# Patient Record
Sex: Female | Born: 1970 | Race: White | Hispanic: No | Marital: Married | Smoking: Never smoker
Health system: Southern US, Community
[De-identification: ages and names within clinical notes are randomized; demographics above are authoritative.]

## PROBLEM LIST (undated history)

## (undated) DIAGNOSIS — Z9012 Acquired absence of left breast and nipple: Secondary | ICD-10-CM

## (undated) DIAGNOSIS — C801 Malignant (primary) neoplasm, unspecified: Secondary | ICD-10-CM

## (undated) HISTORY — DX: Acquired absence of left breast and nipple: Z90.12

## (undated) HISTORY — DX: Malignant (primary) neoplasm, unspecified: C80.1

## (undated) HISTORY — PX: MASTECTOMY, PARTIAL: SHX709

## (undated) HISTORY — PX: AXILLARY SENTINEL NODE BIOPSY: SHX5738

---

## 1997-04-03 ENCOUNTER — Inpatient Hospital Stay (HOSPITAL_COMMUNITY): Admission: AD | Admit: 1997-04-03 | Discharge: 1997-04-03 | Payer: Self-pay | Admitting: Obstetrics and Gynecology

## 1997-05-14 ENCOUNTER — Ambulatory Visit (HOSPITAL_COMMUNITY): Admission: RE | Admit: 1997-05-14 | Discharge: 1997-05-14 | Payer: Self-pay | Admitting: Gynecology

## 1997-05-14 ENCOUNTER — Inpatient Hospital Stay (HOSPITAL_COMMUNITY): Admission: AD | Admit: 1997-05-14 | Discharge: 1997-05-14 | Payer: Self-pay | Admitting: General Surgery

## 1997-07-07 ENCOUNTER — Other Ambulatory Visit: Admission: RE | Admit: 1997-07-07 | Discharge: 1997-07-07 | Payer: Self-pay | Admitting: Gynecology

## 1997-10-05 ENCOUNTER — Inpatient Hospital Stay (HOSPITAL_COMMUNITY): Admission: AD | Admit: 1997-10-05 | Discharge: 1997-10-07 | Payer: Self-pay | Admitting: Obstetrics and Gynecology

## 1997-11-24 ENCOUNTER — Other Ambulatory Visit: Admission: RE | Admit: 1997-11-24 | Discharge: 1997-11-24 | Payer: Self-pay | Admitting: Obstetrics and Gynecology

## 1999-03-08 ENCOUNTER — Encounter: Payer: Self-pay | Admitting: Urology

## 1999-03-08 ENCOUNTER — Encounter: Admission: RE | Admit: 1999-03-08 | Discharge: 1999-03-08 | Payer: Self-pay | Admitting: Urology

## 1999-09-05 ENCOUNTER — Other Ambulatory Visit: Admission: RE | Admit: 1999-09-05 | Discharge: 1999-09-05 | Payer: Self-pay | Admitting: Obstetrics and Gynecology

## 2000-10-04 ENCOUNTER — Other Ambulatory Visit: Admission: RE | Admit: 2000-10-04 | Discharge: 2000-10-04 | Payer: Self-pay | Admitting: Gynecology

## 2001-08-11 ENCOUNTER — Inpatient Hospital Stay (HOSPITAL_COMMUNITY): Admission: AD | Admit: 2001-08-11 | Discharge: 2001-08-13 | Payer: Self-pay | Admitting: Gynecology

## 2001-09-24 ENCOUNTER — Other Ambulatory Visit: Admission: RE | Admit: 2001-09-24 | Discharge: 2001-09-24 | Payer: Self-pay | Admitting: Gynecology

## 2003-10-14 ENCOUNTER — Other Ambulatory Visit: Admission: RE | Admit: 2003-10-14 | Discharge: 2003-10-14 | Payer: Self-pay | Admitting: Gynecology

## 2006-04-30 ENCOUNTER — Other Ambulatory Visit: Admission: RE | Admit: 2006-04-30 | Discharge: 2006-04-30 | Payer: Self-pay | Admitting: Gynecology

## 2007-05-03 ENCOUNTER — Other Ambulatory Visit: Admission: RE | Admit: 2007-05-03 | Discharge: 2007-05-03 | Payer: Self-pay | Admitting: Gynecology

## 2007-08-06 DIAGNOSIS — L709 Acne, unspecified: Secondary | ICD-10-CM | POA: Insufficient documentation

## 2008-06-29 ENCOUNTER — Ambulatory Visit: Payer: Self-pay | Admitting: Family Medicine

## 2010-06-03 NOTE — Discharge Summary (Signed)
   NAME:  Megan Ayala, Megan Ayala                         ACCOUNT NO.:  [id number]   MEDICAL RECORD NO.:  [id number]                   PATIENT TYPE:  INP   LOCATION:  9135                                 FACILITY:  [facility]   PHYSICIAN:  Timothy P. Fontaine, M.D.           DATE OF BIRTH:  1970/04/19   DATE OF ADMISSION:  08/11/2001  DATE OF DISCHARGE:  08/13/2001                                 DISCHARGE SUMMARY   DISCHARGE DIAGNOSES:  1. Intrauterine pregnancy at 37 weeks, delivered.  2. Positive group B strep.  3. Rh negative.   HISTORY:  This is a 40 years of age married white female gravida 2 para 1  with an EDC of August 28, 2001.  Prenatal course uncomplicated, Rh negative,  was given RhoGAM in her pregnancy.   HOSPITAL COURSE:  On August 11, 2001 the patient was admitted at 37 weeks in  labor.  She was found to be in triage rim, -1, with a bulging bag of water.  Therefore, the patient was admitted and subsequently underwent a spontaneous  vaginal delivery of a female, Apgars of 9 and 9, weight  5 pounds, 7 ounces.  Postpartum the patient remained afebrile, voiding, in  stable condition.  She was discharged home on August 13, 2001 and given  instructions.  The baby is Rh negative; therefore, the patient was not given  any RhoGAM.   LABORATORY DATA:  On August 12, 2001 hemoglobin 12.6.   DISPOSITION:  The patient discharged to home, given prescription for Vicodin  to take p.r.n. for pain, #20.       Susa Loffler, P.A.                    Timothy P. Audie Box, M.D.    Ardath Sax  D:  09/13/2001  T:  09/13/2001  Job:  [id number]

## 2011-11-29 ENCOUNTER — Ambulatory Visit: Payer: Self-pay | Admitting: Orthopedic Surgery

## 2011-12-17 HISTORY — PX: MENISCUS REPAIR: SHX5179

## 2012-06-13 ENCOUNTER — Ambulatory Visit: Payer: Self-pay | Admitting: Family Medicine

## 2012-11-26 ENCOUNTER — Ambulatory Visit: Payer: Self-pay | Admitting: Podiatry

## 2013-09-05 ENCOUNTER — Ambulatory Visit (INDEPENDENT_AMBULATORY_CARE_PROVIDER_SITE_OTHER): Payer: BC Managed Care – PPO | Admitting: Podiatry

## 2013-09-05 ENCOUNTER — Ambulatory Visit (INDEPENDENT_AMBULATORY_CARE_PROVIDER_SITE_OTHER): Payer: BC Managed Care – PPO

## 2013-09-05 ENCOUNTER — Encounter: Payer: Self-pay | Admitting: Podiatry

## 2013-09-05 VITALS — BP 114/74 | HR 64 | Resp 16 | Ht 66.0 in | Wt 145.0 lb

## 2013-09-05 DIAGNOSIS — S92919A Unspecified fracture of unspecified toe(s), initial encounter for closed fracture: Secondary | ICD-10-CM

## 2013-09-05 DIAGNOSIS — S92911A Unspecified fracture of right toe(s), initial encounter for closed fracture: Secondary | ICD-10-CM

## 2013-09-05 DIAGNOSIS — S93609A Unspecified sprain of unspecified foot, initial encounter: Secondary | ICD-10-CM

## 2013-09-05 NOTE — Progress Notes (Signed)
   Subjective:    Patient ID: Megan Ayala, female    DOB: 07-Oct-1970, 43 y.o.   MRN: [id number]  HPI Comments: i have broken my 5th toe on my rt foot several times. i broke it the last time, last Sunday. i have broken it 3 or 4 times in the last year. Its getting worse. It hurts when you squeeze it. i tape my toes together.   Foot Pain Associated symptoms include abdominal pain.      Review of Systems  Gastrointestinal: Positive for abdominal pain and diarrhea.  All other systems reviewed and are negative.      Objective:   Physical Exam        Assessment & Plan:

## 2013-09-07 NOTE — Progress Notes (Signed)
Subjective:     Patient ID: Megan Ayala, female   DOB: Jul 10, 1970, 43 y.o.   MRN: [id number]  Foot Pain   patient presents stating I'm convinced I broke my fifth toe several times the worse pain this last week on Sunday   Review of Systems  All other systems reviewed and are negative.      Objective:   Physical Exam  Nursing note and vitals reviewed. Constitutional: She is oriented to person, place, and time.  Cardiovascular: Intact distal pulses.   Musculoskeletal: Normal range of motion.  Neurological: She is oriented to person, place, and time.  Skin: Skin is warm.   neurovascular status intact with muscle strength adequate range of motion of subtalar and midtarsal joint within normal limits. Mild edema around the fifth toe right but no color changes noted and is moderately discomfort at the head of the proximal phalanx with no instability of the toe noted. Patient's digits are well-perfused and she's well oriented x3     Assessment:     Possibility for fracture of the fifth toe right foot versus inflammatory contusion    Plan:     H&P and x-rays reviewed. I explained that there may be an old fracture here but I do not see a current acute fracture and at this point I would advised on wider shoes and trying to go barefoot as much as possible and wear open toed shoes when possible

## 2014-04-17 ENCOUNTER — Encounter: Payer: Self-pay | Admitting: Podiatry

## 2014-04-17 ENCOUNTER — Ambulatory Visit (INDEPENDENT_AMBULATORY_CARE_PROVIDER_SITE_OTHER): Payer: BLUE CROSS/BLUE SHIELD | Admitting: Podiatry

## 2014-04-17 ENCOUNTER — Other Ambulatory Visit: Payer: Self-pay | Admitting: Podiatry

## 2014-04-17 ENCOUNTER — Ambulatory Visit (INDEPENDENT_AMBULATORY_CARE_PROVIDER_SITE_OTHER): Payer: BLUE CROSS/BLUE SHIELD

## 2014-04-17 VITALS — BP 112/54 | HR 65 | Resp 16

## 2014-04-17 DIAGNOSIS — M779 Enthesopathy, unspecified: Secondary | ICD-10-CM

## 2014-04-17 DIAGNOSIS — M7662 Achilles tendinitis, left leg: Secondary | ICD-10-CM

## 2014-04-17 DIAGNOSIS — M722 Plantar fascial fibromatosis: Secondary | ICD-10-CM | POA: Diagnosis not present

## 2014-04-17 MED ORDER — TRIAMCINOLONE ACETONIDE 10 MG/ML IJ SUSP
10.0000 mg | Freq: Once | INTRAMUSCULAR | Status: AC
  Administered 2014-04-17: 10 mg

## 2014-04-17 NOTE — Patient Instructions (Addendum)
Plantar Fasciitis (Heel Spur Syndrome) with Rehab The plantar fascia is a fibrous, ligament-like, soft-tissue structure that spans the bottom of the foot. Plantar fasciitis is a condition that causes pain in the foot due to inflammation of the tissue. SYMPTOMS   Pain and tenderness on the underneath side of the foot.  Pain that worsens with standing or walking. CAUSES  Plantar fasciitis is caused by irritation and injury to the plantar fascia on the underneath side of the foot. Common mechanisms of injury include:  Direct trauma to bottom of the foot.  Damage to a small nerve that runs under the foot where the main fascia attaches to the heel bone.  Stress placed on the plantar fascia due to bone spurs. RISK INCREASES WITH:   Activities that place stress on the plantar fascia (running, jumping, pivoting, or cutting).  Poor strength and flexibility.  Improperly fitted shoes.  Tight calf muscles.  Flat feet.  Failure to warm-up properly before activity.  Obesity. PREVENTION  Warm up and stretch properly before activity.  Allow for adequate recovery between workouts.  Maintain physical fitness:  Strength, flexibility, and endurance.  Cardiovascular fitness.  Maintain a health body weight.  Avoid stress on the plantar fascia.  Wear properly fitted shoes, including arch supports for individuals who have flat feet.  PROGNOSIS  If treated properly, then the symptoms of plantar fasciitis usually resolve without surgery. However, occasionally surgery is necessary.  RELATED COMPLICATIONS   Recurrent symptoms that may result in a chronic condition.  Problems of the lower back that are caused by compensating for the injury, such as limping.  Pain or weakness of the foot during push-off following surgery.  Chronic inflammation, scarring, and partial or complete fascia tear, occurring more often from repeated injections.  TREATMENT  Treatment initially involves the  use of ice and medication to help reduce pain and inflammation. The use of strengthening and stretching exercises may help reduce pain with activity, especially stretches of the Achilles tendon. These exercises may be performed at home or with a therapist. Your caregiver may recommend that you use heel cups of arch supports to help reduce stress on the plantar fascia. Occasionally, corticosteroid injections are given to reduce inflammation. If symptoms persist for greater than 6 months despite non-surgical (conservative), then surgery may be recommended.   MEDICATION   If pain medication is necessary, then nonsteroidal anti-inflammatory medications, such as aspirin and ibuprofen, or other minor pain relievers, such as acetaminophen, are often recommended.  Do not take pain medication within 7 days before surgery.  Prescription pain relievers may be given if deemed necessary by your caregiver. Use only as directed and only as much as you need.  Corticosteroid injections may be given by your caregiver. These injections should be reserved for the most serious cases, because they may only be given a certain number of times.  HEAT AND COLD  Cold treatment (icing) relieves pain and reduces inflammation. Cold treatment should be applied for 10 to 15 minutes every 2 to 3 hours for inflammation and pain and immediately after any activity that aggravates your symptoms. Use ice packs or massage the area with a piece of ice (ice massage).  Heat treatment may be used prior to performing the stretching and strengthening activities prescribed by your caregiver, physical therapist, or athletic trainer. Use a heat pack or soak the injury in warm water.  SEEK IMMEDIATE MEDICAL CARE IF:  Treatment seems to offer no benefit, or the condition worsens.  Any medications   produce adverse side effects.  EXERCISES- RANGE OF MOTION (ROM) AND STRETCHING EXERCISES - Plantar Fasciitis (Heel Spur Syndrome) These exercises  may help you when beginning to rehabilitate your injury. Your symptoms may resolve with or without further involvement from your physician, physical therapist or athletic trainer. While completing these exercises, remember:   Restoring tissue flexibility helps normal motion to return to the joints. This allows healthier, less painful movement and activity.  An effective stretch should be held for at least 30 seconds.  A stretch should never be painful. You should only feel a gentle lengthening or release in the stretched tissue.  RANGE OF MOTION - Toe Extension, Flexion  Sit with your right / left leg crossed over your opposite knee.  Grasp your toes and gently pull them back toward the top of your foot. You should feel a stretch on the bottom of your toes and/or foot.  Hold this stretch for 10 seconds.  Now, gently pull your toes toward the bottom of your foot. You should feel a stretch on the top of your toes and or foot.  Hold this stretch for 10 seconds. Repeat  times. Complete this stretch 3 times per day.   RANGE OF MOTION - Ankle Dorsiflexion, Active Assisted  Remove shoes and sit on a chair that is preferably not on a carpeted surface.  Place right / left foot under knee. Extend your opposite leg for support.  Keeping your heel down, slide your right / left foot back toward the chair until you feel a stretch at your ankle or calf. If you do not feel a stretch, slide your bottom forward to the edge of the chair, while still keeping your heel down.  Hold this stretch for 10 seconds. Repeat 3 times. Complete this stretch 2 times per day.   STRETCH  Gastroc, Standing  Place hands on [place].  Extend right / left leg, keeping the front knee somewhat bent.  Slightly point your toes inward on your back foot.  Keeping your right / left heel on the floor and your knee straight, shift your weight toward the [place], not allowing your back to arch.  You should feel a gentle stretch  in the right / left calf. Hold this position for 10 seconds. Repeat 3 times. Complete this stretch 2 times per day.  STRETCH  Soleus, Standing  Place hands on [place].  Extend right / left leg, keeping the other knee somewhat bent.  Slightly point your toes inward on your back foot.  Keep your right / left heel on the floor, bend your back knee, and slightly shift your weight over the back leg so that you feel a gentle stretch deep in your back calf.  Hold this position for 10 seconds. Repeat 3 times. Complete this stretch 2 times per day.  STRETCH  Gastrocsoleus, Standing  Note: This exercise can place a lot of stress on your foot and ankle. Please complete this exercise only if specifically instructed by your caregiver.   Place the ball of your right / left foot on a step, keeping your other foot firmly on the same step.  Hold on to the [place] or a rail for balance.  Slowly lift your other foot, allowing your body weight to press your heel down over the edge of the step.  You should feel a stretch in your right / left calf.  Hold this position for 10 seconds.  Repeat this exercise with a slight bend in your right /   left knee. Repeat 3 times. Complete this stretch 2 times per day.   STRENGTHENING EXERCISES - Plantar Fasciitis (Heel Spur Syndrome)  These exercises may help you when beginning to rehabilitate your injury. They may resolve your symptoms with or without further involvement from your physician, physical therapist or athletic trainer. While completing these exercises, remember:   Muscles can gain both the endurance and the strength needed for everyday activities through controlled exercises.  Complete these exercises as instructed by your physician, physical therapist or athletic trainer. Progress the resistance and repetitions only as guided.  STRENGTH - Towel Curls  Sit in a chair positioned on a non-carpeted surface.  Place your foot on a towel, keeping your heel  on the floor.  Pull the towel toward your heel by only curling your toes. Keep your heel on the floor. Repeat 3 times. Complete this exercise 2 times per day.  STRENGTH - Ankle Inversion  Secure one end of a rubber exercise band/tubing to a fixed object (table, pole). Loop the other end around your foot just before your toes.  Place your fists between your knees. This will focus your strengthening at your ankle.  Slowly, pull your big toe up and in, making sure the band/tubing is positioned to resist the entire motion.  Hold this position for 10 seconds.  Have your muscles resist the band/tubing as it slowly pulls your foot back to the starting position. Repeat 3 times. Complete this exercises 2 times per day.  Document Released: 01/02/2005 Document Revised: 03/27/2011 Document Reviewed: 04/16/2008 [employer] Patient Information 2014 [employer]. Achilles Tendinitis  with Rehab Achilles tendinitis is a disorder of the Achilles tendon. The Achilles tendon connects the large calf muscles (Gastrocnemius and Soleus) to the heel bone (calcaneus). This tendon is sometimes called the heel cord. It is important for pushing-off and standing on your toes and is important for walking, running, or jumping. Tendinitis is often caused by overuse and repetitive microtrauma. SYMPTOMS  Pain, tenderness, swelling, warmth, and redness may occur over the Achilles tendon even at rest.  Pain with pushing off, or flexing or extending the ankle.  Pain that is worsened after or during activity. CAUSES   Overuse sometimes seen with rapid increase in exercise programs or in sports requiring running and jumping.  Poor physical conditioning (strength and flexibility or endurance).  Running sports, especially training running down hills.  Inadequate warm-up before practice or play or failure to stretch before participation.  Injury to the tendon. PREVENTION   Warm up and stretch before practice or  competition.  Allow time for adequate rest and recovery between practices and competition.  Keep up conditioning.  Keep up ankle and leg flexibility.  Improve or keep muscle strength and endurance.  Improve cardiovascular fitness.  Use proper technique.  Use proper equipment (shoes, skates).  To help prevent recurrence, taping, protective strapping, or an adhesive bandage may be recommended for several weeks after healing is complete. PROGNOSIS   Recovery may take weeks to several months to heal.  Longer recovery is expected if symptoms have been prolonged.  Recovery is usually quicker if the inflammation is due to a direct blow as compared with overuse or sudden strain. RELATED COMPLICATIONS   Healing time will be prolonged if the condition is not correctly treated. The injury must be given plenty of time to heal.  Symptoms can reoccur if activity is resumed too soon.  Untreated, tendinitis may increase the risk of tendon rupture requiring additional time for recovery   and possibly surgery. TREATMENT   The first treatment consists of rest anti-inflammatory medication, and ice to relieve the pain.  Stretching and strengthening exercises after resolution of pain will likely help reduce the risk of recurrence. Referral to a physical therapist or athletic trainer for further evaluation and treatment may be helpful.  A walking boot or cast may be recommended to rest the Achilles tendon. This can help break the cycle of inflammation and microtrauma.  Arch supports (orthotics) may be prescribed or recommended by your caregiver as an adjunct to therapy and rest.  Surgery to remove the inflamed tendon lining or degenerated tendon tissue is rarely necessary and has shown less than predictable results. MEDICATION   Nonsteroidal anti-inflammatory medications, such as aspirin and ibuprofen, may be used for pain and inflammation relief. Do not take within 7 days before surgery. Take  these as directed by your caregiver. Contact your caregiver immediately if any bleeding, stomach upset, or signs of allergic reaction occur. Other minor pain relievers, such as acetaminophen, may also be used.  Pain relievers may be prescribed as necessary by your caregiver. Do not take prescription pain medication for longer than 4 to 7 days. Use only as directed and only as much as you need.  Cortisone injections are rarely indicated. Cortisone injections may weaken tendons and predispose to rupture. It is better to give the condition more time to heal than to use them. HEAT AND COLD  Cold is used to relieve pain and reduce inflammation for acute and chronic Achilles tendinitis. Cold should be applied for 10 to 15 minutes every 2 to 3 hours for inflammation and pain and immediately after any activity that aggravates your symptoms. Use ice packs or an ice massage.  Heat may be used before performing stretching and strengthening activities prescribed by your caregiver. Use a heat pack or a warm soak. SEEK MEDICAL CARE IF:  Symptoms get worse or do not improve in 2 weeks despite treatment.  New, unexplained symptoms develop. Drugs used in treatment may produce side effects.  EXERCISES:  RANGE OF MOTION (ROM) AND STRETCHING EXERCISES - Achilles Tendinitis  These exercises may help you when beginning to rehabilitate your injury. Your symptoms may resolve with or without further involvement from your physician, physical therapist or athletic trainer. While completing these exercises, remember:   Restoring tissue flexibility helps normal motion to return to the joints. This allows healthier, less painful movement and activity.  An effective stretch should be held for at least 30 seconds.  A stretch should never be painful. You should only feel a gentle lengthening or release in the stretched tissue.  STRETCH  Gastroc, Standing   Place hands on [place].  Extend right / left leg, keeping the  front knee somewhat bent.  Slightly point your toes inward on your back foot.  Keeping your right / left heel on the floor and your knee straight, shift your weight toward the [place], not allowing your back to arch.  You should feel a gentle stretch in the right / left calf. Hold this position for 10 seconds. Repeat 3 times. Complete this stretch 2 times per day.  STRETCH  Soleus, Standing   Place hands on [place].  Extend right / left leg, keeping the other knee somewhat bent.  Slightly point your toes inward on your back foot.  Keep your right / left heel on the floor, bend your back knee, and slightly shift your weight over the back leg so that you feel a   gentle stretch deep in your back calf.  Hold this position for 10 seconds. Repeat 3 times. Complete this stretch 2 times per day.  STRETCH  Gastrocsoleus, Standing  Note: This exercise can place a lot of stress on your foot and ankle. Please complete this exercise only if specifically instructed by your caregiver.   Place the ball of your right / left foot on a step, keeping your other foot firmly on the same step.  Hold on to the [place] or a rail for balance.  Slowly lift your other foot, allowing your body weight to press your heel down over the edge of the step.  You should feel a stretch in your right / left calf.  Hold this position for 10 seconds.  Repeat this exercise with a slight bend in your knee. Repeat 3 times. Complete this stretch 2 times per day.   STRENGTHENING EXERCISES - Achilles Tendinitis These exercises may help you when beginning to rehabilitate your injury. They may resolve your symptoms with or without further involvement from your physician, physical therapist or athletic trainer. While completing these exercises, remember:   Muscles can gain both the endurance and the strength needed for everyday activities through controlled exercises.  Complete these exercises as instructed by your physician,  physical therapist or athletic trainer. Progress the resistance and repetitions only as guided.  You may experience muscle soreness or fatigue, but the pain or discomfort you are trying to eliminate should never worsen during these exercises. If this pain does worsen, stop and make certain you are following the directions exactly. If the pain is still present after adjustments, discontinue the exercise until you can discuss the trouble with your clinician.  STRENGTH - Plantar-flexors   Sit with your right / left leg extended. Holding onto both ends of a rubber exercise band/tubing, loop it around the ball of your foot. Keep a slight tension in the band.  Slowly push your toes away from you, pointing them downward.  Hold this position for 10 seconds. Return slowly, controlling the tension in the band/tubing. Repeat 3 times. Complete this exercise 2 times per day.   STRENGTH - Plantar-flexors   Stand with your feet shoulder width apart. Steady yourself with a [place] or table using as little support as needed.  Keeping your weight evenly spread over the width of your feet, rise up on your toes.*  Hold this position for 10 seconds. Repeat 3 times. Complete this exercise 2 times per day.  *If this is too easy, shift your weight toward your right / left leg until you feel challenged. Ultimately, you may be asked to do this exercise with your right / left foot only.  STRENGTH  Plantar-flexors, Eccentric  Note: This exercise can place a lot of stress on your foot and ankle. Please complete this exercise only if specifically instructed by your caregiver.   Place the balls of your feet on a step. With your hands, use only enough support from a [place] or rail to keep your balance.  Keep your knees straight and rise up on your toes.  Slowly shift your weight entirely to your right / left toes and pick up your opposite foot. Gently and with controlled movement, lower your weight through your right /  left foot so that your heel drops below the level of the step. You will feel a slight stretch in the back of your calf at the end position.  Use the healthy leg to help rise up onto   the balls of both feet, then lower weight only on the right / left leg again. Build up to 15 repetitions. Then progress to 3 consecutive sets of 15 repetitions.*  After completing the above exercise, complete the same exercise with a slight knee bend (about 30 degrees). Again, build up to 15 repetitions. Then progress to 3 consecutive sets of 15 repetitions.* Perform this exercise 2 times per day.  *When you easily complete 3 sets of 15, your physician, physical therapist or athletic trainer may advise you to add resistance by wearing a backpack filled with additional weight.  STRENGTH - Plantar Flexors, Seated   Sit on a chair that allows your feet to rest flat on the ground. If necessary, sit at the edge of the chair.  Keeping your toes firmly on the ground, lift your right / left heel as far as you can without increasing any discomfort in your ankle. Repeat 3 times. Complete this exercise 2 times a day.  

## 2014-04-20 NOTE — Progress Notes (Signed)
Subjective:     Patient ID: Megan Ayala, female   DOB: [birth year], 44 y.o.   MRN: [id number]  HPI patient presents stating I have had heel pain in the right bottom of the heel for several months and the back of my left heel has started to bother me with activity. I'm a very active person   Review of Systems     Objective:   Physical Exam  Her vascular status intact with muscle strength adequate range of motion within normal limits. Patient's noted to have plantar pain right of a moderate to intense nature at the insertion to the calcaneus with moderate depression of the arch is also noted to have mild posterior lateral pain of the Achilles tendon    Assessment:     Plantar fasciitis right with Achilles tendinitis of a moderate nature left    Plan:     H&P and both conditions discussed. Today I injected the right plantar fascia 3 mg Kenalog 5 mg Xylocaine and advised on physical therapy stretching exercises and Achilles tendons stretching for the left. Also went ahead for the right and I dispense fascial brace with instructions

## 2014-04-24 ENCOUNTER — Ambulatory Visit: Payer: BLUE CROSS/BLUE SHIELD | Admitting: Podiatry

## 2014-07-06 DIAGNOSIS — E559 Vitamin D deficiency, unspecified: Secondary | ICD-10-CM | POA: Insufficient documentation

## 2014-07-06 DIAGNOSIS — F32A Depression, unspecified: Secondary | ICD-10-CM | POA: Insufficient documentation

## 2014-07-06 DIAGNOSIS — R5383 Other fatigue: Secondary | ICD-10-CM | POA: Insufficient documentation

## 2014-07-06 DIAGNOSIS — K589 Irritable bowel syndrome without diarrhea: Secondary | ICD-10-CM | POA: Insufficient documentation

## 2014-07-06 DIAGNOSIS — F419 Anxiety disorder, unspecified: Secondary | ICD-10-CM | POA: Insufficient documentation

## 2014-07-06 DIAGNOSIS — F329 Major depressive disorder, single episode, unspecified: Secondary | ICD-10-CM | POA: Insufficient documentation

## 2014-07-06 DIAGNOSIS — K5900 Constipation, unspecified: Secondary | ICD-10-CM | POA: Insufficient documentation

## 2014-07-08 ENCOUNTER — Ambulatory Visit (INDEPENDENT_AMBULATORY_CARE_PROVIDER_SITE_OTHER): Payer: BLUE CROSS/BLUE SHIELD | Admitting: Physician Assistant

## 2014-07-08 ENCOUNTER — Encounter: Payer: Self-pay | Admitting: Physician Assistant

## 2014-07-08 VITALS — BP 100/62 | HR 78 | Temp 98.7°F | Resp 16 | Wt 150.6 lb

## 2014-07-08 DIAGNOSIS — R197 Diarrhea, unspecified: Secondary | ICD-10-CM

## 2014-07-08 DIAGNOSIS — E038 Other specified hypothyroidism: Secondary | ICD-10-CM | POA: Diagnosis not present

## 2014-07-08 NOTE — Patient Instructions (Signed)

## 2014-07-08 NOTE — Progress Notes (Signed)
   Subjective:    Patient ID: Megan Ayala, female    DOB: Aug 04, 1970, 44 y.o.   MRN: [id number]  Diarrhea  This is a recurrent problem. The current episode started 1 to 4 weeks ago. The problem occurs 2 to 4 times per day. The problem has been gradually improving. The stool consistency is described as watery. The patient states that diarrhea does not awaken her from sleep. Associated symptoms include abdominal pain, bloating, increased flatus and sweats. Pertinent negatives include no arthralgias, chills, coughing, fever, headaches, myalgias, URI, vomiting or weight loss. Nothing aggravates the symptoms. There are no known risk factors. Treatments tried: probiotics. The treatment provided mild relief. Her past medical history is significant for irritable bowel syndrome. There is no history of bowel resection, inflammatory bowel disease, malabsorption, a recent abdominal surgery or short gut syndrome.      Review of Systems  Constitutional: Negative for fever, chills, weight loss, fatigue and unexpected weight change.  HENT: Negative for trouble swallowing.   Respiratory: Negative for cough, choking, chest tightness, shortness of breath and wheezing.   Cardiovascular: Negative for chest pain, palpitations and leg swelling.  Gastrointestinal: Positive for abdominal pain, diarrhea, bloating and flatus. Negative for vomiting.  Endocrine: Negative for cold intolerance, heat intolerance, polydipsia, polyphagia and polyuria.  Genitourinary: Negative for dysuria, urgency, hematuria, flank pain, vaginal bleeding, vaginal discharge, difficulty urinating, vaginal pain, menstrual problem and pelvic pain.  Musculoskeletal: Negative for myalgias, arthralgias and neck stiffness.  Skin: Negative for color change and rash.  Allergic/Immunologic: Negative for environmental allergies, food allergies and immunocompromised state.  Neurological: Negative for dizziness, tremors, syncope, light-headedness, numbness  and headaches.  Hematological: Negative for adenopathy. Does not bruise/bleed easily.       Objective:   Physical Exam  Constitutional: She appears well-developed and well-nourished. No distress.  HENT:  Mouth/Throat: Uvula is midline, oropharynx is clear and moist and mucous membranes are normal.  Neck: Normal range of motion. Neck supple. No tracheal deviation present. No thyromegaly present.  Cardiovascular: Normal rate, regular rhythm and normal heart sounds.  Exam reveals no gallop and no friction rub.   No murmur heard. Pulmonary/Chest: Effort normal and breath sounds normal. No respiratory distress. She has no wheezes. She has no rales. She exhibits no tenderness.  Abdominal: Soft. Bowel sounds are normal. She exhibits no distension and no mass. There is no hepatosplenomegaly. There is tenderness in the right lower quadrant, epigastric area, periumbilical area, suprapubic area and left lower quadrant. There is no rebound, no guarding and no CVA tenderness. No hernia.  Lymphadenopathy:    She has no cervical adenopathy.  Skin: She is not diaphoretic.  Vitals reviewed.         Assessment & Plan:  1. Other specified hypothyroidism H/O this and has been off Synthroid for over one year.  Last TSH was 10.3 in 05/2013.  Will recheck labs and f/u pending lab results.  - TSH - T4, free - T3  2. Diarrhea H/O this.  A couple of years ago was the first occurrence.  Stool samples were ordered but she never did them.  Will recheck stool for cause.  Seems to be most likely IBS.  Offered KUB to check for constipation with leak around diarrhea, but she refused.  Will f/u pending labs.  - Stool Culture - Stool, WBC/Lactoferrin - Stool C-Diff Toxin Assay - CBC w/Diff

## 2014-07-09 ENCOUNTER — Telehealth: Payer: Self-pay

## 2014-07-09 LAB — CBC WITH DIFFERENTIAL/PLATELET
Basophils Absolute: 0 10*3/uL (ref 0.0–0.2)
Basos: 0 %
EOS (ABSOLUTE): 0.1 10*3/uL (ref 0.0–0.4)
Eos: 1 %
Hematocrit: 38.7 % (ref 34.0–46.6)
Hemoglobin: 13 g/dL (ref 11.1–15.9)
IMMATURE GRANS (ABS): 0 10*3/uL (ref 0.0–0.1)
IMMATURE GRANULOCYTES: 0 %
LYMPHS: 26 %
Lymphocytes Absolute: 2 10*3/uL (ref 0.7–3.1)
MCH: 30.1 pg (ref 26.6–33.0)
MCHC: 33.6 g/dL (ref 31.5–35.7)
MCV: 90 fL (ref 79–97)
MONOS ABS: 0.5 10*3/uL (ref 0.1–0.9)
Monocytes: 7 %
NEUTROS PCT: 66 %
Neutrophils Absolute: 5.1 10*3/uL (ref 1.4–7.0)
Platelets: 246 10*3/uL (ref 150–379)
RBC: 4.32 x10E6/uL (ref 3.77–5.28)
RDW: 13.5 % (ref 12.3–15.4)
WBC: 7.7 10*3/uL (ref 3.4–10.8)

## 2014-07-09 LAB — T4, FREE: Free T4: 0.87 ng/dL (ref 0.82–1.77)

## 2014-07-09 LAB — TSH: TSH: 5.69 u[IU]/mL — ABNORMAL HIGH (ref 0.450–4.500)

## 2014-07-09 LAB — T3: T3 TOTAL: 87 ng/dL (ref 71–180)

## 2014-07-09 MED ORDER — LEVOTHYROXINE SODIUM 25 MCG PO TABS: 25.0000 ug | ORAL_TABLET | Freq: Every day | ORAL | Status: DC

## 2014-07-09 NOTE — Telephone Encounter (Signed)
Patient advised as directed below. Patient verbalized understanding and agrees with treatment plan. 

## 2014-07-09 NOTE — Addendum Note (Signed)
Addended by: Mar Daring on: 07/09/2014 09:24 AM   Modules accepted: Orders

## 2014-07-09 NOTE — Telephone Encounter (Signed)
-----   Message from Mar Daring, PA-C sent at 07/09/2014  9:26 AM EDT ----- TSH is elevated indicating hypothyroidism.  Restart levothyroxine 25 mcg daily.  Will recheck TSH in 3 months.  All other labs are WNL.  No elevated WBC indicating no infection.  Thanks! -JB

## 2014-07-14 ENCOUNTER — Other Ambulatory Visit: Payer: Self-pay | Admitting: Physician Assistant

## 2014-07-16 ENCOUNTER — Telehealth: Payer: Self-pay

## 2014-07-16 LAB — CLOSTRIDIUM DIFFICILE EIA: C difficile Toxins A+B, EIA: NEGATIVE

## 2014-07-16 LAB — FECAL LACTOFERRIN, QUANT: Lactoferrin, Fecal, Quant.: 2.57 ug/mL(g) (ref 0.00–7.24)

## 2014-07-16 NOTE — Telephone Encounter (Signed)
Patient advised as directed below. Patient verbalized understanding.  

## 2014-07-16 NOTE — Telephone Encounter (Signed)
-----   Message from Mar Daring, PA-C sent at 07/16/2014  8:14 AM EDT ----- Stool culture is negative.  Other stool test still pending.  Thanks! JB

## 2014-07-18 LAB — STOOL CULTURE

## 2014-07-22 ENCOUNTER — Telehealth: Payer: Self-pay

## 2014-07-22 NOTE — Telephone Encounter (Signed)
-----   Message from Mar Daring, PA-C sent at 07/22/2014 11:34 AM EDT ----- Stool culture negative.

## 2014-07-22 NOTE — Telephone Encounter (Signed)
Patient advised as directed below. Patient verbalized understanding.  

## 2014-07-29 ENCOUNTER — Other Ambulatory Visit: Payer: Self-pay | Admitting: Family Medicine

## 2014-07-29 DIAGNOSIS — F32A Depression, unspecified: Secondary | ICD-10-CM

## 2014-07-29 DIAGNOSIS — F329 Major depressive disorder, single episode, unspecified: Secondary | ICD-10-CM

## 2014-07-29 MED ORDER — SERTRALINE HCL 100 MG PO TABS: 100.0000 mg | ORAL_TABLET | Freq: Every day | ORAL | Status: DC

## 2014-07-29 NOTE — Telephone Encounter (Signed)
Pt contacted office for refill request on the following medications:  sertraline (ZOLOFT) 100 MG tablet.  [facility] [address].  [phone].  This was pt of Debbie's/MJ

## 2015-04-02 ENCOUNTER — Encounter: Payer: Self-pay | Admitting: Physician Assistant

## 2015-04-02 ENCOUNTER — Ambulatory Visit (INDEPENDENT_AMBULATORY_CARE_PROVIDER_SITE_OTHER): Payer: BLUE CROSS/BLUE SHIELD | Admitting: Physician Assistant

## 2015-04-02 VITALS — BP 102/62 | HR 76 | Temp 98.2°F | Resp 18 | Wt 150.0 lb

## 2015-04-02 DIAGNOSIS — N644 Mastodynia: Secondary | ICD-10-CM

## 2015-04-02 NOTE — Patient Instructions (Signed)

## 2015-04-02 NOTE — Progress Notes (Signed)
Patient ID: Megan Ayala, female   DOB: 24-Feb-1970, 45 y.o.   MRN: [id number]       Patient: Megan Ayala Female    DOB: 1970-03-10   45 y.o.   MRN: [id number] Visit Date: 04/02/2015  Today's Provider: Mar Daring, PA-C   Chief Complaint  Patient presents with  . Breast Pain    Bilateral   Subjective:    HPI Pt reports that about a month ago she noticed that her both of her breast felt tender. She reports that they are tender to the touch and it is only intermittent. She has not done any physical activities that would cause it or had any URI symptoms. She reports that it could be hormonal but she isn't sure. She has not felt any lumps in her breast. She has been performing self breast exams but has never had a mammogram.    Allergies  Allergen Reactions  . Levaquin [Levofloxacin In D5w] Other (See Comments)    Heart race  . Prozac [Fluoxetine Hcl] Rash   Previous Medications   B COMPLEX-C PO    Take 1 tablet by mouth daily.   CHOLECALCIFEROL (VITAMIN D) 2000 UNITS CAPS    Take 2 capsules by mouth daily.   FLUTICASONE (FLONASE) 50 MCG/ACT NASAL SPRAY    Place 2 sprays into the nose daily.   LEVOTHYROXINE (SYNTHROID, LEVOTHROID) 25 MCG TABLET    Take 1 tablet (25 mcg total) by mouth daily.   MELATONIN ER PO    Take by mouth daily.   MELOXICAM (MOBIC) 15 MG TABLET    Take 15 mg by mouth daily.   MULTIPLE VITAMIN PO    Take 1 tablet by mouth daily.   SERTRALINE (ZOLOFT) 100 MG TABLET    Take 1 tablet (100 mg total) by mouth daily.    Review of Systems  Constitutional: Negative.   HENT: Negative.   Eyes: Negative.   Respiratory: Negative.   Cardiovascular: Negative.   Gastrointestinal: Negative.   Endocrine: Negative.   Genitourinary:       Breast pain  Musculoskeletal: Negative.   Skin: Negative.   Allergic/Immunologic: Negative.   Neurological: Negative.   Hematological: Negative.   Psychiatric/Behavioral: Negative.     Social History  Substance Use  Topics  . Smoking status: Never Smoker   . Smokeless tobacco: Not on file  . Alcohol Use: Yes   Objective:   There were no vitals taken for this visit.  Physical Exam  Constitutional: She appears well-developed and well-nourished. No distress.  Cardiovascular: Normal rate, regular rhythm and normal heart sounds.  Exam reveals no gallop and no friction rub.   No murmur heard. Pulmonary/Chest: Effort normal and breath sounds normal. No respiratory distress. She has no wheezes. She has no rales. Right breast exhibits no inverted nipple, no mass, no nipple discharge, no skin change and no tenderness. Left breast exhibits no inverted nipple, no mass, no nipple discharge, no skin change and no tenderness. Breasts are symmetrical.  Skin: She is not diaphoretic.  Vitals reviewed.       Assessment & Plan:     1. Breast tenderness in female She is going to see if it seems to correlate near her menstrual cycle as she thinks it may but is unsure.  She has had no lumps, breast changes, texture changes in skin, asymmetry, or nipple discharge.  She is to continue self breast exams and I will see her back in 8 weeks for her CPE  with pap.  We will order mammogram at that time.       Mar Daring, PA-C  [facility]

## 2015-05-28 ENCOUNTER — Encounter: Payer: BLUE CROSS/BLUE SHIELD | Admitting: Physician Assistant

## 2015-06-04 ENCOUNTER — Ambulatory Visit (INDEPENDENT_AMBULATORY_CARE_PROVIDER_SITE_OTHER): Payer: BLUE CROSS/BLUE SHIELD | Admitting: Physician Assistant

## 2015-06-04 ENCOUNTER — Encounter: Payer: Self-pay | Admitting: Physician Assistant

## 2015-06-04 VITALS — BP 90/60 | HR 89 | Temp 98.2°F | Resp 16 | Ht 67.0 in | Wt 151.0 lb

## 2015-06-04 DIAGNOSIS — Z13220 Encounter for screening for lipoid disorders: Secondary | ICD-10-CM

## 2015-06-04 DIAGNOSIS — Z136 Encounter for screening for cardiovascular disorders: Secondary | ICD-10-CM

## 2015-06-04 DIAGNOSIS — Z1239 Encounter for other screening for malignant neoplasm of breast: Secondary | ICD-10-CM | POA: Diagnosis not present

## 2015-06-04 DIAGNOSIS — Z124 Encounter for screening for malignant neoplasm of cervix: Secondary | ICD-10-CM | POA: Diagnosis not present

## 2015-06-04 DIAGNOSIS — Z0000 Encounter for general adult medical examination without abnormal findings: Secondary | ICD-10-CM | POA: Diagnosis not present

## 2015-06-04 DIAGNOSIS — E038 Other specified hypothyroidism: Secondary | ICD-10-CM

## 2015-06-04 DIAGNOSIS — J069 Acute upper respiratory infection, unspecified: Secondary | ICD-10-CM

## 2015-06-04 MED ORDER — AMOXICILLIN 875 MG PO TABS: 875.0000 mg | ORAL_TABLET | Freq: Two times a day (BID) | ORAL | Status: DC

## 2015-06-04 NOTE — Progress Notes (Signed)
Patient: Megan Ayala, Female    DOB: 11-May-1970, 45 y.o.   MRN: [id number] Visit Date: 06/04/2015  Today's Provider: Mar Daring, PA-C   Chief Complaint  Patient presents with  . Annual Exam   Subjective:    Annual physical exam Megan Ayala is a 45 y.o. female who presents today for health maintenance and complete physical. She feels fairly well-she reports she developed the cold that she has a week ago and is not getting any better. Is usually just in the mornings and evenings. No Fever, SOB,wheezing, or chest tightness. She reports exercising 3 times a week, cardio. She reports she is sleeping well.  She did have some breast tenderness about one month ago that was fairly new onset. It was in both breast and no masses noted. Today she admits it is most frequent around her menstrual cycle.  Pap: unknown when her last was Mammogram: Never Colonoscopy: N/A -----------------------------------------------------------------   Review of Systems  Constitutional: Positive for diaphoresis.  HENT: Positive for congestion and rhinorrhea.   Eyes: Negative.   Respiratory: Positive for cough.   Cardiovascular: Negative.   Gastrointestinal: Positive for diarrhea.  Endocrine: Negative.   Genitourinary: Negative.   Musculoskeletal: Negative.   Skin: Negative.   Allergic/Immunologic: Negative.   Neurological: Negative.   Hematological: Negative.   Psychiatric/Behavioral: Negative.     Social History      She  reports that she has never smoked. She does not have any smokeless tobacco history on file. She reports that she drinks alcohol.       Social History   Social History  . Marital Status: Married    Spouse Name: N/A  . Number of Children: N/A  . Years of Education: N/A   Social History Main Topics  . Smoking status: Never Smoker   . Smokeless tobacco: None  . Alcohol Use: Yes  . Drug Use: None  . Sexual Activity: Not Asked   Other Topics Concern  .  None   Social History Narrative    History reviewed. No pertinent past medical history.   Patient Active Problem List   Diagnosis Date Noted  . Diarrhea 07/08/2014  . Anxiety 07/06/2014  . CN (constipation) 07/06/2014  . Clinical depression 07/06/2014  . Fatigue 07/06/2014  . Irritable colon 07/06/2014  . Avitaminosis D 07/06/2014  . Cyst of ovary 06/29/2008  . Fibroid 06/29/2008  . Adult hypothyroidism 01/31/2008  . Acne 08/06/2007  . Vitiligo 08/06/2007  . Cannot sleep 06/26/2007  . Menstrual molimen 06/26/2007    Past Surgical History  Procedure Laterality Date  . Meniscus repair Left 12/2011    Family History        Family Status  Relation Status Death Age  . Mother Alive   . Sister Alive   . Brother Alive   . Son Alive   . Son Alive         Her family history includes Aneurysm in her mother; Diverticulitis in her father and mother; Hyperlipidemia in her father and mother.    Allergies  Allergen Reactions  . Levaquin [Levofloxacin In D5w] Other (See Comments)    Heart race  . Prozac [Fluoxetine Hcl] Rash    Previous Medications   B COMPLEX-C PO    Take 1 tablet by mouth daily.   CHOLECALCIFEROL (VITAMIN D) 2000 UNITS CAPS    Take 2 capsules by mouth daily.   FLUTICASONE (FLONASE) 50 MCG/ACT NASAL SPRAY    Place  2 sprays into the nose daily. Reported on 06/04/2015   LEVOTHYROXINE (SYNTHROID, LEVOTHROID) 25 MCG TABLET    Take 1 tablet (25 mcg total) by mouth daily.   MELATONIN ER PO    Take by mouth daily.   MULTIPLE VITAMIN PO    Take 1 tablet by mouth daily.   SERTRALINE (ZOLOFT) 100 MG TABLET    Take 1 tablet (100 mg total) by mouth daily.    Patient Care Team: Mar Daring, PA-C as PCP - General (Physician Assistant)     Objective:   Vitals: BP 90/60 mmHg  Pulse 89  Temp(Src) 98.2 F (36.8 C) (Oral)  Resp 16  Ht 5\' 7"  (1.702 m)  Wt 151 lb (68.493 kg)  BMI 23.64 kg/m2  LMP 05/28/2015   Physical Exam  Constitutional: She is  oriented to person, place, and time. She appears well-developed and well-nourished. No distress.  HENT:  Head: Normocephalic and atraumatic.  Right Ear: Hearing, external ear and ear canal normal. Tympanic membrane is not erythematous and not bulging. A middle ear effusion is present.  Left Ear: Hearing, external ear and ear canal normal. Tympanic membrane is not erythematous and not bulging. A middle ear effusion is present.  Nose: Mucosal edema present. No rhinorrhea. Right sinus exhibits no maxillary sinus tenderness and no frontal sinus tenderness. Left sinus exhibits no maxillary sinus tenderness and no frontal sinus tenderness.  Mouth/Throat: Uvula is midline, oropharynx is clear and moist and mucous membranes are normal. No oropharyngeal exudate, posterior oropharyngeal edema or posterior oropharyngeal erythema.  Eyes: Conjunctivae and EOM are normal. Pupils are equal, round, and reactive to light. Right eye exhibits no discharge. Left eye exhibits no discharge. No scleral icterus.  Neck: Normal range of motion. Neck supple. No JVD present. Carotid bruit is not present. No tracheal deviation present. No thyromegaly present.  Cardiovascular: Normal rate, regular rhythm, normal heart sounds and intact distal pulses.  Exam reveals no gallop and no friction rub.   No murmur heard. Pulmonary/Chest: Effort normal and breath sounds normal. No respiratory distress. She has no wheezes. She has no rales. She exhibits no tenderness. Right breast exhibits no inverted nipple, no mass, no nipple discharge, no skin change and no tenderness. Left breast exhibits no inverted nipple, no mass, no nipple discharge, no skin change and no tenderness. Breasts are symmetrical.  Abdominal: Soft. Bowel sounds are normal. She exhibits no distension and no mass. There is no tenderness. There is no rebound and no guarding. Hernia confirmed negative in the right inguinal area and confirmed negative in the left inguinal area.    Genitourinary: Rectum normal, vagina normal and uterus normal. No breast swelling, tenderness, discharge or bleeding. Pelvic exam was performed with patient supine. There is no rash, tenderness, lesion or injury on the right labia. There is no rash, tenderness, lesion or injury on the left labia. Cervix exhibits no motion tenderness, no discharge and no friability. Right adnexum displays no mass, no tenderness and no fullness. Left adnexum displays no mass, no tenderness and no fullness. No erythema, tenderness or bleeding in the vagina. No signs of injury around the vagina. No vaginal discharge found.  Musculoskeletal: Normal range of motion. She exhibits no edema or tenderness.  Lymphadenopathy:    She has no cervical adenopathy.       Right: No inguinal adenopathy present.       Left: No inguinal adenopathy present.  Neurological: She is alert and oriented to person, place, and time. She has  normal reflexes. No cranial nerve deficit. Coordination normal.  Skin: Skin is warm and dry. No rash noted. She is not diaphoretic.  Psychiatric: She has a normal mood and affect. Her behavior is normal. Judgment and thought content normal.  Vitals reviewed.    Depression Screen PHQ 2/9 Scores 06/04/2015  PHQ - 2 Score 0      Assessment & Plan:     Routine Health Maintenance and Physical Exam  Exercise Activities and Dietary recommendations Goals    None      Immunization History  Administered Date(s) Administered  . Tdap 07/30/2008    Health Maintenance  Topic Date Due  . HIV Screening  09/03/1987  . PAP SMEAR  09/02/1993  . INFLUENZA VACCINE  11/02/2015 (Originally 08/17/2015)  . TETANUS/TDAP  07/31/2018      Discussed health benefits of physical activity, and encouraged her to engage in regular exercise appropriate for her age and condition.   1. Annual physical exam Normal physical exam today. Will check labs as below and f/u pending lab results. If labs are stable and WNL she  will not need to have these rechecked for one year at her next annual physical exam. She is to call the office in the meantime if she has any acute issue, questions or concerns. - CBC with Differential - Comprehensive metabolic panel  2. Breast cancer screening Breast exam today was normal. There is no family history of breast cancer. She does perform regular self breast exams. Mammogram was ordered as below. Information for [facility] was given to patient so she may schedule her mammogram at her convenience. - Mammogram Digital Screening; Future  3. Cervical cancer screening Pap collected today. Will send as below and f/u pending results. - Pap IG and HPV (high risk) DNA detection ([facility])  4. Other specified hypothyroidism Not taking her levothyroxine 84mcg. Will check labs and f/u pending lab results. - TSH  5. Encounter for lipid screening for cardiovascular disease Will check labs and f/u pending results. - Lipid panel  6. Upper respiratory infection Worsening symptoms greater than 9 days without relief. Will give amoxil as below. Stay well hydrated and get plenty of rest. Call if symptoms do not improve. - amoxicillin (AMOXIL) 875 MG tablet; Take 1 tablet (875 mg total) by mouth 2 (two) times daily.  Dispense: 14 tablet; Refill: 0  --------------------------------------------------------------------

## 2015-06-04 NOTE — Patient Instructions (Signed)
Health Maintenance, Female Adopting a healthy lifestyle and getting preventive care can go a long way to promote health and wellness. Talk with your health care provider about what schedule of regular examinations is right for you. This is a good chance for you to check in with your provider about disease prevention and staying healthy. In between checkups, there are plenty of things you can do on your own. Experts have done a lot of research about which lifestyle changes and preventive measures are most likely to keep you healthy. Ask your health care provider for more information. WEIGHT AND DIET  Eat a healthy diet  Be sure to include plenty of vegetables, fruits, low-fat dairy products, and lean protein.  Do not eat a lot of foods high in solid fats, added sugars, or salt.  Get regular exercise. This is one of the most important things you can do for your health.  Most adults should exercise for at least 150 minutes each week. The exercise should increase your heart rate and make you sweat (moderate-intensity exercise).  Most adults should also do strengthening exercises at least twice a week. This is in addition to the moderate-intensity exercise.  Maintain a healthy weight  Body mass index (BMI) is a measurement that can be used to identify possible weight problems. It estimates body fat based on height and weight. Your health care provider can help determine your BMI and help you achieve or maintain a healthy weight.  For females 43 years of age and older:   A BMI below 18.5 is considered underweight.  A BMI of 18.5 to 24.9 is normal.  A BMI of 25 to 29.9 is considered overweight.  A BMI of 30 and above is considered obese.  Watch levels of cholesterol and blood lipids  You should start having your blood tested for lipids and cholesterol at 43 years of age, then have this test every 5 years.  You may need to have your cholesterol levels checked more often if:  Your lipid  or cholesterol levels are high.  You are older than 43 years of age.  You are at high risk for heart disease.  CANCER SCREENING   Lung Cancer  Lung cancer screening is recommended for adults 43-66 years old who are at high risk for lung cancer because of a history of smoking.  A yearly low-dose CT scan of the lungs is recommended for people who:  Currently smoke.  Have quit within the past 15 years.  Have at least a 30-pack-year history of smoking. A pack year is smoking an average of one pack of cigarettes a day for 1 year.  Yearly screening should continue until it has been 15 years since you quit.  Yearly screening should stop if you develop a health problem that would prevent you from having lung cancer treatment.  Breast Cancer  Practice breast self-awareness. This means understanding how your breasts normally appear and feel.  It also means doing regular breast self-exams. Let your health care provider know about any changes, no matter how small.  If you are in your 20s or 30s, you should have a clinical breast exam (CBE) by a health care provider every 1-3 years as part of a regular health exam.  If you are 25 or older, have a CBE every year. Also consider having a breast X-ray (mammogram) every year.  If you have a family history of breast cancer, talk to your health care provider about genetic screening.  If you  are at high risk for breast cancer, talk to your health care provider about having an MRI and a mammogram every year.  Breast cancer gene (BRCA) assessment is recommended for women who have family members with BRCA-related cancers. BRCA-related cancers include:  Breast.  Ovarian.  Tubal.  Peritoneal cancers.  Results of the assessment will determine the need for genetic counseling and BRCA1 and BRCA2 testing. Cervical Cancer Your health care provider may recommend that you be screened regularly for cancer of the pelvic organs (ovaries, uterus, and  vagina). This screening involves a pelvic examination, including checking for microscopic changes to the surface of your cervix (Pap test). You may be encouraged to have this screening done every 3 years, beginning at age 21.  For women ages 30-65, health care providers may recommend pelvic exams and Pap testing every 3 years, or they may recommend the Pap and pelvic exam, combined with testing for human papilloma virus (HPV), every 5 years. Some types of HPV increase your risk of cervical cancer. Testing for HPV may also be done on women of any age with unclear Pap test results.  Other health care providers may not recommend any screening for nonpregnant women who are considered low risk for pelvic cancer and who do not have symptoms. Ask your health care provider if a screening pelvic exam is right for you.  If you have had past treatment for cervical cancer or a condition that could lead to cancer, you need Pap tests and screening for cancer for at least 20 years after your treatment. If Pap tests have been discontinued, your risk factors (such as having a new sexual partner) need to be reassessed to determine if screening should resume. Some women have medical problems that increase the chance of getting cervical cancer. In these cases, your health care provider may recommend more frequent screening and Pap tests. Colorectal Cancer  This type of cancer can be detected and often prevented.  Routine colorectal cancer screening usually begins at 43 years of age and continues through 43 years of age.  Your health care provider may recommend screening at an earlier age if you have risk factors for colon cancer.  Your health care provider may also recommend using home test kits to check for hidden blood in the stool.  A small camera at the end of a tube can be used to examine your colon directly (sigmoidoscopy or colonoscopy). This is done to check for the earliest forms of colorectal  cancer.  Routine screening usually begins at age 50.  Direct examination of the colon should be repeated every 5-10 years through 43 years of age. However, you may need to be screened more often if early forms of precancerous polyps or small growths are found. Skin Cancer  Check your skin from head to toe regularly.  Tell your health care provider about any new moles or changes in moles, especially if there is a change in a mole's shape or color.  Also tell your health care provider if you have a mole that is larger than the size of a pencil eraser.  Always use sunscreen. Apply sunscreen liberally and repeatedly throughout the day.  Protect yourself by wearing long sleeves, pants, a wide-brimmed hat, and sunglasses whenever you are outside. HEART DISEASE, DIABETES, AND HIGH BLOOD PRESSURE   High blood pressure causes heart disease and increases the risk of stroke. High blood pressure is more likely to develop in:  People who have blood pressure in the high end   of the normal range (130-139/85-89 mm Hg).  People who are overweight or obese.  People who are African American.  If you are 38-23 years of age, have your blood pressure checked every 3-5 years. If you are 61 years of age or older, have your blood pressure checked every year. You should have your blood pressure measured twice--once when you are at a hospital or clinic, and once when you are not at a hospital or clinic. Record the average of the two measurements. To check your blood pressure when you are not at a hospital or clinic, you can use:  An automated blood pressure machine at a pharmacy.  A home blood pressure monitor.  If you are between 45 years and 39 years old, ask your health care provider if you should take aspirin to prevent strokes.  Have regular diabetes screenings. This involves taking a blood sample to check your fasting blood sugar level.  If you are at a normal weight and have a low risk for diabetes,  have this test once every three years after 43 years of age.  If you are overweight and have a high risk for diabetes, consider being tested at a younger age or more often. PREVENTING INFECTION  Hepatitis B  If you have a higher risk for hepatitis B, you should be screened for this virus. You are considered at high risk for hepatitis B if:  You were born in a country where hepatitis B is common. Ask your health care provider which countries are considered high risk.  Your parents were born in a high-risk country, and you have not been immunized against hepatitis B (hepatitis B vaccine).  You have HIV or AIDS.  You use needles to inject street drugs.  You live with someone who has hepatitis B.  You have had sex with someone who has hepatitis B.  You get hemodialysis treatment.  You take certain medicines for conditions, including cancer, organ transplantation, and autoimmune conditions. Hepatitis C  Blood testing is recommended for:  Everyone born from [birth year] through 1963.  Anyone with known risk factors for hepatitis C. Sexually transmitted infections (STIs)  You should be screened for sexually transmitted infections (STIs) including gonorrhea and chlamydia if:  You are sexually active and are younger than 43 years of age.  You are older than 43 years of age and your health care provider tells you that you are at risk for this type of infection.  Your sexual activity has changed since you were last screened and you are at an increased risk for chlamydia or gonorrhea. Ask your health care provider if you are at risk.  If you do not have HIV, but are at risk, it may be recommended that you take a prescription medicine daily to prevent HIV infection. This is called pre-exposure prophylaxis (PrEP). You are considered at risk if:  You are sexually active and do not regularly use condoms or know the HIV status of your partner(s).  You take drugs by injection.  You are sexually  active with a partner who has HIV. Talk with your health care provider about whether you are at high risk of being infected with HIV. If you choose to begin PrEP, you should first be tested for HIV. You should then be tested every 3 months for as long as you are taking PrEP.  PREGNANCY   If you are premenopausal and you may become pregnant, ask your health care provider about preconception counseling.  If you may  become pregnant, take 400 to 800 micrograms (mcg) of folic acid every day.  If you want to prevent pregnancy, talk to your health care provider about birth control (contraception). OSTEOPOROSIS AND MENOPAUSE   Osteoporosis is a disease in which the bones lose minerals and strength with aging. This can result in serious bone fractures. Your risk for osteoporosis can be identified using a bone density scan.  If you are 61 years of age or older, or if you are at risk for osteoporosis and fractures, ask your health care provider if you should be screened.  Ask your health care provider whether you should take a calcium or vitamin D supplement to lower your risk for osteoporosis.  Menopause may have certain physical symptoms and risks.  Hormone replacement therapy may reduce some of these symptoms and risks. Talk to your health care provider about whether hormone replacement therapy is right for you.  HOME CARE INSTRUCTIONS   Schedule regular health, dental, and eye exams.  Stay current with your immunizations.   Do not use any tobacco products including cigarettes, chewing tobacco, or electronic cigarettes.  If you are pregnant, do not drink alcohol.  If you are breastfeeding, limit how much and how often you drink alcohol.  Limit alcohol intake to no more than 1 drink per day for nonpregnant women. One drink equals 12 ounces of beer, 5 ounces of wine, or 1 ounces of hard liquor.  Do not use street drugs.  Do not share needles.  Ask your health care provider for help if  you need support or information about quitting drugs.  Tell your health care provider if you often feel depressed.  Tell your health care provider if you have ever been abused or do not feel safe at home.   This information is not intended to replace advice given to you by your health care provider. Make sure you discuss any questions you have with your health care provider.   Document Released: 07/18/2010 Document Revised: 01/23/2014 Document Reviewed: 12/04/2012 Elsevier Interactive Patient Education [employer].

## 2015-06-09 ENCOUNTER — Telehealth: Payer: Self-pay

## 2015-06-09 LAB — PAP IG AND HPV HIGH-RISK
HPV, HIGH-RISK: NEGATIVE
PAP Smear Comment: 0

## 2015-06-09 NOTE — Telephone Encounter (Signed)
LMTCB  Thanks,  -Joseline 

## 2015-06-09 NOTE — Telephone Encounter (Signed)
-----   Message from Mar Daring, PA-C sent at 06/09/2015  8:16 AM EDT ----- Pap is normal.  HPV negative, GC/Chlamydia neg. Will repeat in 3 years.

## 2015-06-10 NOTE — Telephone Encounter (Signed)
LMTCB  Thanks,  -Joseline 

## 2015-06-18 NOTE — Telephone Encounter (Signed)
Pt advised on voicemail on preferred phone number-aa

## 2015-06-21 ENCOUNTER — Encounter: Payer: Self-pay | Admitting: Physician Assistant

## 2015-06-21 ENCOUNTER — Ambulatory Visit (INDEPENDENT_AMBULATORY_CARE_PROVIDER_SITE_OTHER): Payer: BLUE CROSS/BLUE SHIELD | Admitting: Physician Assistant

## 2015-06-21 VITALS — BP 100/70 | HR 72 | Temp 98.7°F | Resp 16 | Wt 150.2 lb

## 2015-06-21 DIAGNOSIS — J0100 Acute maxillary sinusitis, unspecified: Secondary | ICD-10-CM

## 2015-06-21 DIAGNOSIS — J069 Acute upper respiratory infection, unspecified: Secondary | ICD-10-CM

## 2015-06-21 MED ORDER — AMOXICILLIN 875 MG PO TABS: 875.0000 mg | ORAL_TABLET | Freq: Two times a day (BID) | ORAL | Status: DC

## 2015-06-21 NOTE — Progress Notes (Signed)
Patient: Megan Ayala Female    DOB: 11/24/1970   45 y.o.   MRN: [id number] Visit Date: 06/21/2015  Today's Provider: Mar Daring, PA-C   Chief Complaint  Patient presents with  . Sinusitis   Subjective:    Sinusitis This is a new problem. The current episode started in the past 7 days (On Thursday). The problem has been gradually worsening since onset. There has been no fever. Associated symptoms include chills, congestion, coughing, shortness of breath (a little), sinus pressure and sneezing. Pertinent negatives include no ear pain, headaches or sore throat. Past treatments include oral decongestants (Nyquil and Dayquil). The treatment provided no relief.      Allergies  Allergen Reactions  . Levaquin [Levofloxacin In D5w] Other (See Comments)    Heart race  . Prozac [Fluoxetine Hcl] Rash   Previous Medications   AMOXICILLIN (AMOXIL) 875 MG TABLET    Take 1 tablet (875 mg total) by mouth 2 (two) times daily.   B COMPLEX-C PO    Take 1 tablet by mouth daily.   CHOLECALCIFEROL (VITAMIN D) 2000 UNITS CAPS    Take 2 capsules by mouth daily.   FLUTICASONE (FLONASE) 50 MCG/ACT NASAL SPRAY    Place 2 sprays into the nose daily. Reported on 06/21/2015   LEVOTHYROXINE (SYNTHROID, LEVOTHROID) 25 MCG TABLET    Take 1 tablet (25 mcg total) by mouth daily.   MELATONIN ER PO    Take by mouth daily.   MULTIPLE VITAMIN PO    Take 1 tablet by mouth daily.   SERTRALINE (ZOLOFT) 100 MG TABLET    Take 1 tablet (100 mg total) by mouth daily.    Review of Systems  Constitutional: Positive for chills.  HENT: Positive for congestion, postnasal drip, rhinorrhea, sinus pressure and sneezing. Negative for ear pain and sore throat.   Respiratory: Positive for cough, chest tightness, shortness of breath (a little) and wheezing (a little).   Cardiovascular: Negative for chest pain, palpitations and leg swelling.  Gastrointestinal: Negative for nausea, vomiting and abdominal pain.    Neurological: Negative for dizziness and headaches.    Social History  Substance Use Topics  . Smoking status: Never Smoker   . Smokeless tobacco: Not on file  . Alcohol Use: Yes   Objective:   BP 100/70 mmHg  Pulse 72  Temp(Src) 98.7 F (37.1 C) (Oral)  Resp 16  Wt 150 lb 3.2 oz (68.13 kg)  SpO2 97%  LMP 05/28/2015  Physical Exam  Constitutional: She appears well-developed and well-nourished. No distress.  HENT:  Head: Normocephalic and atraumatic.  Right Ear: Hearing, tympanic membrane, external ear and ear canal normal.  Left Ear: Hearing, tympanic membrane, external ear and ear canal normal.  Nose: Mucosal edema and rhinorrhea present. Right sinus exhibits maxillary sinus tenderness. Right sinus exhibits no frontal sinus tenderness. Left sinus exhibits maxillary sinus tenderness. Left sinus exhibits no frontal sinus tenderness.  Mouth/Throat: Uvula is midline and mucous membranes are normal. Posterior oropharyngeal erythema present. No oropharyngeal exudate or posterior oropharyngeal edema.  Neck: Normal range of motion. Neck supple. No tracheal deviation present. No thyromegaly present.  Cardiovascular: Normal rate, regular rhythm and normal heart sounds.  Exam reveals no gallop and no friction rub.   No murmur heard. Pulmonary/Chest: Effort normal and breath sounds normal. No stridor. No respiratory distress. She has no wheezes. She has no rales.  Lymphadenopathy:    She has no cervical adenopathy.  Skin: She is not  diaphoretic.  Vitals reviewed.       Assessment & Plan:     1. Acute maxillary sinusitis, recurrence not specified Worsening symptoms that has not responded to OTC medications. I will give amoxicillin as below. She has some cough syrup left over from a previous URI. She may use tylenol or IBU for fever and body aches. She is to call if symptoms fail to improve or worsen. - amoxicillin (AMOXIL) 875 MG tablet; Take 1 tablet (875 mg total) by mouth 2 (two)  times daily.  Dispense: 14 tablet; Refill: 0  2. Upper respiratory infection See above medical treatment plan. - amoxicillin (AMOXIL) 875 MG tablet; Take 1 tablet (875 mg total) by mouth 2 (two) times daily.  Dispense: 14 tablet; Refill: 0       Mar Daring, PA-C  [facility]

## 2015-06-21 NOTE — Patient Instructions (Signed)

## 2015-07-05 ENCOUNTER — Telehealth: Payer: Self-pay | Admitting: Physician Assistant

## 2015-07-05 DIAGNOSIS — J0100 Acute maxillary sinusitis, unspecified: Secondary | ICD-10-CM

## 2015-07-05 MED ORDER — AZITHROMYCIN 250 MG PO TABS: ORAL_TABLET | ORAL | Status: DC

## 2015-07-05 NOTE — Telephone Encounter (Signed)
Zpak sent to [facility]

## 2015-07-05 NOTE — Telephone Encounter (Signed)
Pt still having symptoms from a few weeks ago and she would like a new RX called in b/c she is still having problem.  Cough, sneezing and runny nose.  PT uses [facility] in [place]

## 2015-11-22 ENCOUNTER — Ambulatory Visit (INDEPENDENT_AMBULATORY_CARE_PROVIDER_SITE_OTHER): Payer: No Typology Code available for payment source | Admitting: Physician Assistant

## 2015-11-22 ENCOUNTER — Encounter: Payer: Self-pay | Admitting: Physician Assistant

## 2015-11-22 ENCOUNTER — Telehealth: Payer: Self-pay | Admitting: Physician Assistant

## 2015-11-22 VITALS — BP 90/60 | HR 67 | Temp 98.3°F | Resp 16 | Wt 145.4 lb

## 2015-11-22 DIAGNOSIS — K5180 Other ulcerative colitis without complications: Secondary | ICD-10-CM | POA: Diagnosis not present

## 2015-11-22 DIAGNOSIS — Z136 Encounter for screening for cardiovascular disorders: Secondary | ICD-10-CM | POA: Diagnosis not present

## 2015-11-22 DIAGNOSIS — Z23 Encounter for immunization: Secondary | ICD-10-CM | POA: Diagnosis not present

## 2015-11-22 DIAGNOSIS — Z13220 Encounter for screening for lipoid disorders: Secondary | ICD-10-CM

## 2015-11-22 DIAGNOSIS — E039 Hypothyroidism, unspecified: Secondary | ICD-10-CM

## 2015-11-22 DIAGNOSIS — R5383 Other fatigue: Secondary | ICD-10-CM | POA: Diagnosis not present

## 2015-11-22 DIAGNOSIS — E559 Vitamin D deficiency, unspecified: Secondary | ICD-10-CM

## 2015-11-22 MED ORDER — BUDESONIDE 3 MG PO CPEP: 9.0000 mg | ORAL_CAPSULE | Freq: Every day | ORAL | 0 refills | Status: DC

## 2015-11-22 NOTE — Telephone Encounter (Signed)
Pt states she was in earlier and seen Perry.  She was prescribed budesonide 3 mg.  When she went to the pharmacy they told her it was going to be 740.00 for generic and 2100.00 for name brand.  Is there something else she can take.    Her call back is [phone]  Thank sTeri

## 2015-11-22 NOTE — Patient Instructions (Signed)
Colitis Colitis is inflammation of the colon. Colitis may last a short time (acute) or it may last a long time (chronic). CAUSES This condition may be caused by:  Viruses.  Bacteria.  Reactions to medicine.  Certain autoimmune diseases, such as Crohn disease or ulcerative colitis. SYMPTOMS Symptoms of this condition include:  Diarrhea.  Passing bloody or tarry stool.  Pain.  Fever.  Vomiting.  Tiredness (fatigue).  Weight loss.  Bloating.  Sudden increase in abdominal pain.  Having fewer bowel movements than usual. DIAGNOSIS This condition is diagnosed with a stool test or a blood test. You may also have other tests, including X-rays, a CT scan, or a colonoscopy. TREATMENT Treatment may include:  Resting the bowel. This involves not eating or drinking for a period of time.  Fluids that are given through an IV tube.  Medicine for pain and diarrhea.  Antibiotic medicines.  Cortisone medicines.  Surgery. HOME CARE INSTRUCTIONS Eating and Drinking  Follow instructions from your health care provider about eating or drinking restrictions.  Drink enough fluid to keep your urine clear or pale yellow.  Work with a dietitian to determine which foods cause your condition to flare up.  Avoid foods that cause flare-ups.  Eat a well-balanced diet. Medicines  Take over-the-counter and prescription medicines only as told by your health care provider.  If you were prescribed an antibiotic medicine, take it as told by your health care provider. Do not stop taking the antibiotic even if you start to feel better. General Instructions  Keep all follow-up visits as told by your health care provider. This is important. SEEK MEDICAL CARE IF:  Your symptoms do not go away.  You develop new symptoms. SEEK IMMEDIATE MEDICAL CARE IF:  You have a fever that does not go away with treatment.  You develop chills.  You have extreme weakness, fainting, or  dehydration.  You have repeated vomiting.  You develop severe pain in your abdomen.  You pass bloody or tarry stool.   This information is not intended to replace advice given to you by your health care provider. Make sure you discuss any questions you have with your health care provider.   Document Released: 02/10/2004 Document Revised: 09/23/2014 Document Reviewed: 04/27/2014 Elsevier Interactive Patient Education 2016 [place] Disease Crohn disease is a long-lasting (chronic) disease that affects your gastrointestinal (GI) tract. It often causes irritation and swelling (inflammation) in your small intestine and the beginning of your large intestine. However, it can affect any part of your GI tract. Crohn disease is part of a group of illnesses that are known as inflammatory bowel disease (IBD). Crohn disease may start slowly and get worse over time. Symptoms may come and go. They may also disappear for months or even years at a time (remission). CAUSES The exact cause of Crohn disease is not known. It may be a response that causes your body's defense system (immune system) to mistakenly attack healthy cells and tissues (autoimmune response). Your genes and your environment may also play a role. RISK FACTORS You may be at greater risk for Crohn disease if you:  Have other family members with Crohn disease or another IBD.  Use any tobacco products, including cigarettes, chewing tobacco, or electronic cigarettes.  Are in your 39s.  Have [place] European ancestry. SIGNS AND SYMPTOMS The main signs and symptoms of Crohn disease involve your GI tract. These include:  Diarrhea.  Rectal bleeding.  An urgent need to move your bowels.  The  feeling that you are not finished having a bowel movement.  Abdominal pain or cramping.  Constipation. General signs and symptoms of Crohn disease may also include:  Unexplained weight loss.  Fatigue.  Fever.  Nausea.  Loss  of appetite.  Joint pain  Changes in vision.  Red bumps on your skin. DIAGNOSIS Your health care provider may suspect Crohn disease based on your symptoms and your medical history. Your health care provider will do a physical exam. You may need to see a health care provider who specializes in diseases of the digestive tract (gastroenterologist). You may also have tests to help your health care providers make a diagnosis. These may include:  Blood tests.  Stool sample tests.  Imaging tests, such as X-rays and CT scans.  Tests to examine the inside of your intestines using a long, flexible tube that has a light and a camera on the end (endoscopy or colonoscopy).  A procedure to take tissue samples from inside your bowel (biopsy) to be examined under a microscope. TREATMENT  There is no cure for Crohn disease. Treatment will focus on managing your symptoms. Crohn disease affects each person differently. Your treatment may include:  Resting your bowels. Drinking only clear liquids or getting nutrition through an IV for a period of time gives your bowels a chance to heal because they are not passing stools.  Medicines. These may be used alone or in combination (combination therapy). These may include antibiotic medicines. You may be given medicines that help to:  Reduce inflammation.  Control your immune system activity.  Fight infections.  Relieve cramps and prevent diarrhea.  Control your pain.  Surgery. You may need surgery if:  Medicines and other treatments are no longer working.  You develop complications from severe Crohn disease.  A section of your intestine becomes so damaged that it needs to be removed. HOME CARE INSTRUCTIONS  Take medicines only as directed by your health care provider.  If you were prescribed an antibiotic medicine, finish it all even if you start to feel better.  Keep all follow-up visits as directed by your health care provider. This is  important.  Talk with your health care provider about changing your diet. This may help your symptoms. Your health care provide may recommend changes, such as:  Drinking more fluids.  Avoiding milk and other foods that contain lactose.  Eating a low-fat diet.  Avoiding high-fiber foods, such as popcorn and nuts.  Avoiding carbonated beverages, such as soda.  Eating smaller meals more often rather than eating large meals.  Keeping a food diary to identify foods that make your symptoms better or worse.  Do not use any tobacco products, including cigarettes, chewing tobacco, or electronic cigarettes. If you need help quitting, ask your health care provider.  Limit alcohol intake to no more than 1 drink per day for nonpregnant women and 2 drinks per day for men. One drink equals 12 ounces of beer, 5 ounces of wine, or 1 ounces of hard liquor.  Exercise daily or as directed by your health care provider. SEEK MEDICAL CARE IF:  You have diarrhea, abdominal cramps, and other gastrointestinal problems that are present almost all of the time.  Your symptoms do not improve with treatment.  You continue to lose weight.  You develop a rash or sores on your skin.  You develop eye problems.  You have a fever.   Your symptoms get worse.  You develop new symptoms. SEEK IMMEDIATE MEDICAL CARE IF:  You have bloody diarrhea.  You develop severe abdominal pain.  You cannot pass stools.   This information is not intended to replace advice given to you by your health care provider. Make sure you discuss any questions you have with your health care provider.   Document Released: 10/12/2004 Document Revised: 01/23/2014 Document Reviewed: 08/20/2013 Elsevier Interactive Patient Education [employer].

## 2015-11-22 NOTE — Progress Notes (Signed)
Patient: Megan Ayala Female    DOB: 1970/02/23   45 y.o.   MRN: [id number] Visit Date: 11/22/2015  Today's Provider: Mar Daring, PA-C   Chief Complaint  Patient presents with  . Diarrhea   Subjective:    Diarrhea   This is a chronic problem. Episode onset: Friday was her worst day. The problem has been unchanged. Associated symptoms include abdominal pain. Pertinent negatives include no headaches or vomiting. She has tried change of diet for the symptoms.   Patient reports that she went to see Marylu Lund, NP, GI, last year. She underwent a colonoscopy and was told she had inflammatory colitis and was prescribed Budenoside 9 mg daily. She did not take until completion but has went without a flare from July 2016 until now. She has been trying to watch her diet and continues probiotic. She denies any melena or hematochezia. She denies nausea or vomiting. States she had been having 5-6 loose BM daily until today, she has not had any. She does report some mucous in the stools and tenesmus. She has also been having increasing fatigue. She has previously been hypothyroid, but has not been taking her levothyroxine.   She also missed getting her labs done at her CPE and would like to check labs today.     Allergies  Allergen Reactions  . Levaquin [Levofloxacin In D5w] Other (See Comments)    Heart race  . Prozac [Fluoxetine Hcl] Rash     Current Outpatient Prescriptions:  Marland Kitchen  MELATONIN ER PO, Take by mouth daily., Disp: , Rfl:  .  levothyroxine (SYNTHROID, LEVOTHROID) 25 MCG tablet, Take 1 tablet (25 mcg total) by mouth daily. (Patient not taking: Reported on 11/22/2015), Disp: 30 tablet, Rfl: 3 .  sertraline (ZOLOFT) 100 MG tablet, Take 1 tablet (100 mg total) by mouth daily. (Patient not taking: Reported on 11/22/2015), Disp: 30 tablet, Rfl: 5  Review of Systems  Constitutional: Positive for activity change (no energy) and fatigue.  HENT: Negative.   Respiratory:  Negative.   Cardiovascular: Negative.   Gastrointestinal: Positive for abdominal pain and diarrhea. Negative for abdominal distention, anal bleeding, blood in stool, constipation, nausea, rectal pain and vomiting.  Genitourinary: Negative.   Musculoskeletal: Negative for back pain.  Neurological: Negative for dizziness, weakness and headaches.  Psychiatric/Behavioral: Negative.     Social History  Substance Use Topics  . Smoking status: Never Smoker  . Smokeless tobacco: Not on file  . Alcohol use Yes   Objective:   BP 90/60 (BP Location: Right Arm, Patient Position: Sitting, Cuff Size: Normal)   Pulse 67   Temp 98.3 F (36.8 C) (Oral)   Resp 16   Wt 145 lb 6.4 oz (66 kg)   BMI 22.77 kg/m   Physical Exam  Constitutional: She is oriented to person, place, and time. She appears well-developed and well-nourished. No distress.  Cardiovascular: Normal rate, regular rhythm and normal heart sounds.  Exam reveals no gallop and no friction rub.   No murmur heard. Pulmonary/Chest: Effort normal and breath sounds normal. No respiratory distress. She has no wheezes. She has no rales.  Abdominal: Soft. Normal appearance and bowel sounds are normal. She exhibits no distension and no mass. There is no hepatosplenomegaly. There is tenderness in the left upper quadrant and left lower quadrant. There is no rebound, no guarding and no CVA tenderness.  No diverticula noted on colonoscopy last year  Neurological: She is alert and oriented to person,  place, and time.  Skin: Skin is warm and dry. She is not diaphoretic.       Assessment & Plan:     1. Other ulcerative colitis without complication (HCC) Worsening symptoms. Will refill budesonide as below. Advised her to please call the office if symptoms worsen or do not resolve. May need to re-establish with Marylu Lund, NP if no improvement. Will check labs as below to evaluate for changes or elevated WBC count. I will f/u pending results. -  CBC with Differential - Comprehensive Metabolic Panel (CMET) - budesonide (ENTOCORT EC) 3 MG 24 hr capsule; Take 3 capsules (9 mg total) by mouth daily.  Dispense: 90 capsule; Refill: 0  2. Fatigue, unspecified type Will check labs as below and f/u pending results. - CBC with Differential - B12 - Vitamin D (25 hydroxy)  3. Avitaminosis D H/O vitamin D def that required Rx Vit D supplementation. Now with increasing fatigue. Will check labs as below and f/u pending results. - Vitamin D (25 hydroxy)  4. Adult hypothyroidism H/O hypothyroidism and not taking levothyroxine supplement. Will check labs as below and f/u pending results. - Thyroid Panel With TSH  5. Encounter for lipid screening for cardiovascular disease Family history of elevated cholesterol. Will check labs as below and f/u pending results. - Lipid Profile  6. Need for influenza vaccination Flu vaccine given today without complication. Patient sat upright for 15 minutes to check for adverse reaction before being released. - Flu Vaccine QUAD 36+ mos IM       Mar Daring, PA-C  [facility]

## 2015-11-23 LAB — COMPREHENSIVE METABOLIC PANEL
ALT: 11 IU/L (ref 0–32)
AST: 17 IU/L (ref 0–40)
Albumin/Globulin Ratio: 2 (ref 1.2–2.2)
Albumin: 4.5 g/dL (ref 3.5–5.5)
Alkaline Phosphatase: 71 IU/L (ref 39–117)
BUN/Creatinine Ratio: 11 (ref 9–23)
BUN: 9 mg/dL (ref 6–24)
Bilirubin Total: 0.6 mg/dL (ref 0.0–1.2)
CALCIUM: 8.9 mg/dL (ref 8.7–10.2)
CO2: 21 mmol/L (ref 18–29)
CREATININE: 0.81 mg/dL (ref 0.57–1.00)
Chloride: 104 mmol/L (ref 96–106)
GFR, EST AFRICAN AMERICAN: 103 mL/min/{1.73_m2} (ref >59)
GFR, EST NON AFRICAN AMERICAN: 89 mL/min/{1.73_m2} (ref >59)
GLUCOSE: 89 mg/dL (ref 65–99)
Globulin, Total: 2.2 g/dL (ref 1.5–4.5)
POTASSIUM: 4.2 mmol/L (ref 3.5–5.2)
Sodium: 142 mmol/L (ref 134–144)
TOTAL PROTEIN: 6.7 g/dL (ref 6.0–8.5)

## 2015-11-23 LAB — CBC WITH DIFFERENTIAL/PLATELET
BASOS ABS: 0 10*3/uL (ref 0.0–0.2)
BASOS: 1 %
EOS (ABSOLUTE): 0.1 10*3/uL (ref 0.0–0.4)
Eos: 1 %
Hematocrit: 38.1 % (ref 34.0–46.6)
Hemoglobin: 12.7 g/dL (ref 11.1–15.9)
IMMATURE GRANS (ABS): 0 10*3/uL (ref 0.0–0.1)
IMMATURE GRANULOCYTES: 0 %
LYMPHS: 28 %
Lymphocytes Absolute: 1.6 10*3/uL (ref 0.7–3.1)
MCH: 29.3 pg (ref 26.6–33.0)
MCHC: 33.3 g/dL (ref 31.5–35.7)
MCV: 88 fL (ref 79–97)
MONOS ABS: 0.3 10*3/uL (ref 0.1–0.9)
Monocytes: 6 %
NEUTROS PCT: 64 %
Neutrophils Absolute: 3.7 10*3/uL (ref 1.4–7.0)
PLATELETS: 232 10*3/uL (ref 150–379)
RBC: 4.34 x10E6/uL (ref 3.77–5.28)
RDW: 13.4 % (ref 12.3–15.4)
WBC: 5.7 10*3/uL (ref 3.4–10.8)

## 2015-11-23 LAB — LIPID PANEL
CHOLESTEROL TOTAL: 173 mg/dL (ref 100–199)
Chol/HDL Ratio: 3.5 ratio units (ref 0.0–4.4)
HDL: 50 mg/dL (ref >39)
LDL Calculated: 108 mg/dL — ABNORMAL HIGH (ref 0–99)
Triglycerides: 76 mg/dL (ref 0–149)
VLDL CHOLESTEROL CAL: 15 mg/dL (ref 5–40)

## 2015-11-23 LAB — THYROID PANEL WITH TSH
FREE THYROXINE INDEX: 1.5 (ref 1.2–4.9)
T3 Uptake Ratio: 25 % (ref 24–39)
T4, Total: 5.8 ug/dL (ref 4.5–12.0)
TSH: 5.58 u[IU]/mL — ABNORMAL HIGH (ref 0.450–4.500)

## 2015-11-23 LAB — VITAMIN D 25 HYDROXY (VIT D DEFICIENCY, FRACTURES): VIT D 25 HYDROXY: 31 ng/mL (ref 30.0–100.0)

## 2015-11-23 LAB — VITAMIN B12: Vitamin B-12: 958 pg/mL — ABNORMAL HIGH (ref 211–946)

## 2015-11-23 NOTE — Telephone Encounter (Signed)
Please advise.  Thanks,  -Jibreel Fedewa 

## 2015-11-23 NOTE — Telephone Encounter (Signed)
Unfortunately this is the only medication for nonspecific inflammatory colitis. I also checked with Dr. Rosanna Randy to make sure that is correct and he agreed. It is recommended to treat for 6-8 weeks during a flare. I still recommend following up with GI again to make sure there have been no changes in treatment options as well.  Also of note her labs were stable and normal. TSH was still slightly elevated like last time but not significantly different. Vit D was WNL but low normal. May benefit from daily supplement of 1000-2000IU vit D.

## 2015-11-23 NOTE — Telephone Encounter (Signed)
LMTCB-KW 

## 2015-11-23 NOTE — Telephone Encounter (Signed)
Patient has been advised. KW 

## 2016-03-24 ENCOUNTER — Telehealth: Payer: Self-pay | Admitting: Emergency Medicine

## 2016-03-24 NOTE — Telephone Encounter (Signed)
It is not 10% specific for that. It is more for inflammatory bowel. But that would work. I have never tested her for gluten intolerance to my knowledge.

## 2016-03-24 NOTE — Telephone Encounter (Signed)
LMTCB

## 2016-03-24 NOTE — Telephone Encounter (Signed)
Pt LM on phone in medical records requesting copy of her test that you had ordered for gluten intolerance. Would this be the fecal lactoferrin test that was done on 07/14/14? Please advise. Thanks.

## 2016-03-27 NOTE — Telephone Encounter (Signed)
Patient advised as below.  

## 2017-03-27 ENCOUNTER — Telehealth: Payer: Self-pay

## 2017-03-27 ENCOUNTER — Encounter: Payer: Self-pay | Admitting: Physician Assistant

## 2017-03-27 ENCOUNTER — Ambulatory Visit (INDEPENDENT_AMBULATORY_CARE_PROVIDER_SITE_OTHER): Payer: No Typology Code available for payment source | Admitting: Physician Assistant

## 2017-03-27 VITALS — BP 116/68 | HR 72 | Temp 97.8°F | Resp 16 | Wt 129.0 lb

## 2017-03-27 DIAGNOSIS — E063 Autoimmune thyroiditis: Secondary | ICD-10-CM

## 2017-03-27 DIAGNOSIS — R42 Dizziness and giddiness: Secondary | ICD-10-CM | POA: Diagnosis not present

## 2017-03-27 DIAGNOSIS — R002 Palpitations: Secondary | ICD-10-CM | POA: Diagnosis not present

## 2017-03-27 DIAGNOSIS — Z862 Personal history of diseases of the blood and blood-forming organs and certain disorders involving the immune mechanism: Secondary | ICD-10-CM

## 2017-03-27 NOTE — Telephone Encounter (Signed)
Patient calling that she was sitting at her desk and she started to feel dizzy and heart rate racing, not seeing clear. No Chest pain,SOB or arm pain. Patient scheduled to come in this morning to see Adriana.

## 2017-03-27 NOTE — Progress Notes (Signed)
Patient: Megan Ayala Female    DOB: 08-29-1970   47 y.o.   MRN: [id number] Visit Date: 03/27/2017  Today's Provider: Trinna Post, PA-C   Chief Complaint  Patient presents with  . Dizziness    Started today  . Near Syncope    Happened three times today   Subjective:    HPI  Megan Ayala is a 47 y/o woman with history of hypothyroidism, iron deficiency and irritable bowel syndrome presenting today for episode of palpitations and anxiety. She reports she was at work on the phone and felt poorly. She ate a muffin but this did not help. She said she noticed palpitations and felt shaky. She had to lie down. She did not pass out. She did not have chest pain, SOB, vertigo. Her symptoms were not related to head motions. She is not on birth control and does not smoke. She did not have any nausea or vomiting. She is not on any new medication. She denies any unusual stress.  She has a history of irritable bowel syndrome but not inflammatory bowel disease. She is not currently on any treatment for that and denies rectal bleeding. She does report some fatigue. She sees a holistic Dr. Lyanne Co in [place], [place] who is treating her for her hypothyroidism. She is currently on 50 mcg of Synthroid and her labs were checked in January. She is also prescribed iron for an apparent iron deficiency which she is not compliant with taking.       Allergies  Allergen Reactions  . Levaquin [Levofloxacin In D5w] Other (See Comments)    Heart race  . Prozac [Fluoxetine Hcl] Rash     Current Outpatient Medications:  .  levothyroxine (SYNTHROID, LEVOTHROID) 25 MCG tablet, Take 1 tablet (25 mcg total) by mouth daily., Disp: 30 tablet, Rfl: 3 .  levothyroxine (SYNTHROID, LEVOTHROID) 50 MCG tablet, Take 50 mcg by mouth daily before breakfast., Disp: , Rfl:  .  budesonide (ENTOCORT EC) 3 MG 24 hr capsule, Take 3 capsules (9 mg total) by mouth daily. (Patient not taking: Reported on 03/27/2017),  Disp: 90 capsule, Rfl: 0 .  MELATONIN ER PO, Take by mouth daily., Disp: , Rfl:  .  sertraline (ZOLOFT) 100 MG tablet, Take 1 tablet (100 mg total) by mouth daily. (Patient not taking: Reported on 03/27/2017), Disp: 30 tablet, Rfl: 5  Review of Systems  Constitutional: Positive for chills and fatigue. Negative for activity change, appetite change, diaphoresis, fever and unexpected weight change.  Respiratory: Positive for shortness of breath. Negative for apnea, cough, choking, chest tightness, wheezing and stridor.   Cardiovascular: Positive for palpitations. Negative for chest pain and leg swelling.  Gastrointestinal: Negative.   Neurological: Positive for dizziness and tremors. Negative for seizures, syncope, facial asymmetry, speech difficulty, weakness, light-headedness, numbness and headaches.    Social History   Tobacco Use  . Smoking status: Never Smoker  . Smokeless tobacco: Never Used  Substance Use Topics  . Alcohol use: Yes   Objective:   BP 116/68 (BP Location: Right Arm, Patient Position: Sitting, Cuff Size: Normal)   Pulse 72   Temp 97.8 F (36.6 C) (Oral)   Resp 16   Wt 129 lb (58.5 kg)   LMP 03/19/2017   SpO2 99%   BMI 20.20 kg/m  Vitals:   03/27/17 1133  BP: 116/68  Pulse: 72  Resp: 16  Temp: 97.8 F (36.6 C)  TempSrc: Oral  SpO2: 99%  Weight:  129 lb (58.5 kg)     Physical Exam  Constitutional: She is oriented to person, place, and time. She appears well-developed and well-nourished.  Cardiovascular: Normal rate and regular rhythm.  Pulmonary/Chest: Effort normal and breath sounds normal.  Lymphadenopathy:    She has no cervical adenopathy.  Neurological: She is alert and oriented to person, place, and time.  Skin: Skin is warm and dry. No pallor.  Psychiatric: She has a normal mood and affect. Her behavior is normal.        Assessment & Plan:     1. Palpitations   Megan Ayala is a 47 y/o woman w/ hx of hypothyroid, iron deficiency  presenting with above symptoms. DDx: overcorrection with synthroid, anemia, dehydration, cardiac arrhythmia, anxiety. EKG today shows normal sinus rhythm. I will get labs on her as below. I have cautioned her on return symptoms. I think if her   - EKG 12-Lead - CBC with Differential - Comprehensive Metabolic Panel (CMET)  2. Hashimoto's thyroiditis  - TSH - T4, free - T3, free  3. Dizziness  - CBC with Differential - Comprehensive Metabolic Panel (CMET)  4. History of anemia  - CBC with Differential - Comprehensive Metabolic Panel (CMET) - Fe+TIBC+Fer  Return if symptoms worsen or fail to improve.  The entirety of the information documented in the History of Present Illness, Review of Systems and Physical Exam were personally obtained by me. Portions of this information were initially documented by Ashley Royalty, CMA and reviewed by me for thoroughness and accuracy.        Trinna Post, PA-C  [facility]

## 2017-03-27 NOTE — Patient Instructions (Signed)
Antithyroglobulin Antibody Test Why am I having this test? This test is used as a marker for autoimmune thyroid diseases and other related diseases. It does this by testing for antibodies that have been formed against the thyroid by your own body. What kind of sample is taken? A blood sample is required for this test. It is usually collected by inserting a needle into a vein. How do I prepare for this test? There is no preparation required for this test. What are the reference ranges? Reference ranges are considered healthy ranges established after testing a large group of healthy people. Reference ranges may vary among different people, labs, and hospitals. It is your responsibility to obtain your test results. Ask the lab or department performing the test when and how you will get your results. Reference ranges are as follows:  Less than 116 international units/mL.  What do the results mean? Increased levels of antibodies may indicate:  Hashimoto thyroiditis.  Rheumatoid arthritis.  Hypothyroidism.  Thyroid cancer.  Talk with your health care provider to discuss your results, treatment options, and if necessary, the need for more tests. Talk with your health care provider if you have any questions about your results. Talk with your health care provider to discuss your results, treatment options, and if necessary, the need for more tests. Talk with your health care provider if you have any questions about your results. This information is not intended to replace advice given to you by your health care provider. Make sure you discuss any questions you have with your health care provider. Document Released: 01/27/2004 Document Revised: 09/06/2015 Document Reviewed: 06/25/2013 Elsevier Interactive Patient Education  [employer].

## 2017-03-28 LAB — COMPREHENSIVE METABOLIC PANEL
ALT: 14 IU/L (ref 0–32)
AST: 17 IU/L (ref 0–40)
Albumin/Globulin Ratio: 1.6 (ref 1.2–2.2)
Albumin: 4.2 g/dL (ref 3.5–5.5)
Alkaline Phosphatase: 78 IU/L (ref 39–117)
BUN/Creatinine Ratio: 15 (ref 9–23)
BUN: 14 mg/dL (ref 6–24)
Bilirubin Total: 0.4 mg/dL (ref 0.0–1.2)
CO2: 23 mmol/L (ref 20–29)
Calcium: 9.2 mg/dL (ref 8.7–10.2)
Chloride: 104 mmol/L (ref 96–106)
Creatinine, Ser: 0.94 mg/dL (ref 0.57–1.00)
GFR calc Af Amer: 85 mL/min/{1.73_m2} (ref >59)
GFR calc non Af Amer: 74 mL/min/{1.73_m2} (ref >59)
Globulin, Total: 2.7 g/dL (ref 1.5–4.5)
Glucose: 95 mg/dL (ref 65–99)
Potassium: 3.8 mmol/L (ref 3.5–5.2)
Sodium: 142 mmol/L (ref 134–144)
Total Protein: 6.9 g/dL (ref 6.0–8.5)

## 2017-03-28 LAB — CBC WITH DIFFERENTIAL/PLATELET
Basophils Absolute: 0 10*3/uL (ref 0.0–0.2)
Basos: 0 %
EOS (ABSOLUTE): 0 10*3/uL (ref 0.0–0.4)
Eos: 0 %
Hematocrit: 39.9 % (ref 34.0–46.6)
Hemoglobin: 13.7 g/dL (ref 11.1–15.9)
Immature Grans (Abs): 0 10*3/uL (ref 0.0–0.1)
Immature Granulocytes: 0 %
Lymphocytes Absolute: 1 10*3/uL (ref 0.7–3.1)
Lymphs: 13 %
MCH: 30.3 pg (ref 26.6–33.0)
MCHC: 34.3 g/dL (ref 31.5–35.7)
MCV: 88 fL (ref 79–97)
Monocytes Absolute: 0.4 10*3/uL (ref 0.1–0.9)
Monocytes: 5 %
Neutrophils Absolute: 6.6 10*3/uL (ref 1.4–7.0)
Neutrophils: 82 %
Platelets: 244 10*3/uL (ref 150–379)
RBC: 4.52 x10E6/uL (ref 3.77–5.28)
RDW: 13.3 % (ref 12.3–15.4)
WBC: 8 10*3/uL (ref 3.4–10.8)

## 2017-03-28 LAB — TSH: TSH: 3.34 u[IU]/mL (ref 0.450–4.500)

## 2017-03-28 LAB — IRON,TIBC AND FERRITIN PANEL
Ferritin: 24 ng/mL (ref 15–150)
Iron Saturation: 29 % (ref 15–55)
Iron: 84 ug/dL (ref 27–159)
Total Iron Binding Capacity: 288 ug/dL (ref 250–450)
UIBC: 204 ug/dL (ref 131–425)

## 2017-03-28 LAB — T4, FREE: Free T4: 1.49 ng/dL (ref 0.82–1.77)

## 2017-03-28 LAB — T3, FREE: T3, Free: 2.7 pg/mL (ref 2.0–4.4)

## 2017-03-30 ENCOUNTER — Telehealth: Payer: Self-pay

## 2017-03-30 NOTE — Telephone Encounter (Signed)
-----   Message from Trinna Post, [place] sent at 03/30/2017  7:54 AM EDT ----- Her labs are essentially normal. Her iron is a little on the low side but she is not anemic. She can resume her iron supplements, or ferrous sulfate 325 mg two to three times daily. This may cause some constipation and she can take some fiber or miralax. Is she still having symptoms? If so, we did discuss a cardiology referral.

## 2017-03-30 NOTE — Telephone Encounter (Signed)
LMTCB 03/30/2017  Thanks,   -Mickel Baas

## 2017-04-02 NOTE — Telephone Encounter (Signed)
Pt advised.   Thanks,   -Laura  

## 2017-10-19 ENCOUNTER — Encounter: Payer: Self-pay | Admitting: Physician Assistant

## 2017-10-19 ENCOUNTER — Ambulatory Visit (INDEPENDENT_AMBULATORY_CARE_PROVIDER_SITE_OTHER): Payer: 59 | Admitting: Physician Assistant

## 2017-10-19 VITALS — BP 122/70 | HR 88 | Temp 98.2°F | Ht 67.0 in | Wt 128.0 lb

## 2017-10-19 DIAGNOSIS — Z23 Encounter for immunization: Secondary | ICD-10-CM

## 2017-10-19 DIAGNOSIS — R0609 Other forms of dyspnea: Secondary | ICD-10-CM

## 2017-10-19 DIAGNOSIS — R079 Chest pain, unspecified: Secondary | ICD-10-CM | POA: Diagnosis not present

## 2017-10-19 DIAGNOSIS — R599 Enlarged lymph nodes, unspecified: Secondary | ICD-10-CM

## 2017-10-19 DIAGNOSIS — E039 Hypothyroidism, unspecified: Secondary | ICD-10-CM

## 2017-10-19 DIAGNOSIS — D508 Other iron deficiency anemias: Secondary | ICD-10-CM

## 2017-10-19 DIAGNOSIS — E559 Vitamin D deficiency, unspecified: Secondary | ICD-10-CM | POA: Diagnosis not present

## 2017-10-19 NOTE — Progress Notes (Signed)
Patient: Megan Ayala    DOB: Feb 18, 1970   47 y.o.   MRN: [id number] Visit Date: 10/19/2017  Today's Provider: Mar Daring, PA-C   Chief Complaint  Patient presents with  . Chest Pain  . Shortness of Breath   Subjective:    HPI Patient presents today C/O SOB and chest pain. She was seen on 03/27/2017 for the same symptoms. EKG and labs were done at that time. Patient states she is having recurrent episodes and requesting a cardiology referral. She reports she is experiencing SOB on exertion and chest pain when she is jogging. She states when she has the chest pain it is sharp and radiates to the jaw area. Last episode was 2 days ago while jogging. Symptoms only occur with exercise. No family history of CAD. Mother does have h/o aneurysm.   Patient reports not taking Levothyroxine since May. She also is Vit D def and not taking supplement. Does have h/o iron def anemia as well.    Allergies  Allergen Reactions  . Levaquin [Levofloxacin In D5w] Other (See Comments)    Heart race  . Prozac [Fluoxetine Hcl] Rash     Current Outpatient Medications:  .  budesonide (ENTOCORT EC) 3 MG 24 hr capsule, Take 3 capsules (9 mg total) by mouth daily. (Patient not taking: Reported on 03/27/2017), Disp: 90 capsule, Rfl: 0 .  levothyroxine (SYNTHROID, LEVOTHROID) 25 MCG tablet, Take 1 tablet (25 mcg total) by mouth daily. (Patient not taking: Reported on 10/19/2017), Disp: 30 tablet, Rfl: 3 .  levothyroxine (SYNTHROID, LEVOTHROID) 50 MCG tablet, Take 50 mcg by mouth daily before breakfast., Disp: , Rfl:  .  MELATONIN ER PO, Take by mouth daily., Disp: , Rfl:   Review of Systems  Constitutional: Negative.   Respiratory: Positive for shortness of breath.   Cardiovascular: Positive for chest pain.  Gastrointestinal: Negative.   Musculoskeletal: Negative.     Social History   Tobacco Use  . Smoking status: Never Smoker  . Smokeless tobacco: Never Used  Substance Use  Topics  . Alcohol use: Yes   Objective:   BP 122/70 (BP Location: Left Arm, Patient Position: Sitting, Cuff Size: Normal)   Pulse 88   Temp 98.2 F (36.8 C) (Oral)   Ht 5\' 7"  (1.702 m)   Wt 128 lb (58.1 kg)   SpO2 97%   BMI 20.05 kg/m  Vitals:   10/19/17 0955  BP: 122/70  Pulse: 88  Temp: 98.2 F (36.8 C)  TempSrc: Oral  SpO2: 97%  Weight: 128 lb (58.1 kg)  Height: 5\' 7"  (1.702 m)     Physical Exam  Constitutional: She appears well-developed and well-nourished. No distress.  Neck: Normal range of motion. Neck supple. No JVD present. No tracheal deviation present. No thyromegaly present.  Cardiovascular: Normal rate, regular rhythm, normal heart sounds, intact distal pulses and normal pulses. Exam reveals no gallop and no friction rub.  No murmur heard. Pulmonary/Chest: Effort normal and breath sounds normal. No respiratory distress. She has no wheezes. She has no rales.  Musculoskeletal:       Right lower leg: She exhibits no edema.       Left lower leg: She exhibits no edema.  Lymphadenopathy:    She has cervical adenopathy (small lymph node noted in right anterior chain).  Skin: She is not diaphoretic.  Vitals reviewed.       Assessment & Plan:     1. Chest pain, unspecified  type Will refer to cardiology for consideration of stress testing since patient is only having chest pain with activity (while jogging but not consistently every time). I will f/u pending results.  - Ambulatory referral to Cardiology  2. DOE (dyspnea on exertion) See above medical treatment plan. - Ambulatory referral to Cardiology  3. Adult hypothyroidism Not taking medication. Will check labs as below and f/u pending results. - TSH - Comprehensive Metabolic Panel (CMET)  4. Avitaminosis D H/O this and not taking supplementation. Will check labs as below and f/u pending results. - CBC w/Diff/Platelet - Comprehensive Metabolic Panel (CMET) - Vitamin D (25 hydroxy)  5. Iron  deficiency anemia secondary to inadequate dietary iron intake H/O this and not taking supplementation. Will check labs as below and f/u pending results. - CBC w/Diff/Platelet - Comprehensive Metabolic Panel (CMET) - Fe+TIBC+Fer  6. Enlarged lymph node Noted on exam today. Patient does have mild sinus symptoms. Advised to use warm compress and monitor. Call if increasing in size or not resolving in 2-3 weeks.  7. Need for influenza vaccination Flu vaccine given today without complication. Patient sat upright for 15 minutes to check for adverse reaction before being released. - Flu Vaccine QUAD 36+ mos IM       Mar Daring, PA-C  [facility]

## 2017-10-20 LAB — COMPREHENSIVE METABOLIC PANEL
A/G RATIO: 2 (ref 1.2–2.2)
ALBUMIN: 4.4 g/dL (ref 3.5–5.5)
ALK PHOS: 66 IU/L (ref 39–117)
ALT: 12 IU/L (ref 0–32)
AST: 14 IU/L (ref 0–40)
BUN / CREAT RATIO: 13 (ref 9–23)
BUN: 10 mg/dL (ref 6–24)
Bilirubin Total: 0.5 mg/dL (ref 0.0–1.2)
CALCIUM: 8.9 mg/dL (ref 8.7–10.2)
CO2: 22 mmol/L (ref 20–29)
CREATININE: 0.76 mg/dL (ref 0.57–1.00)
Chloride: 104 mmol/L (ref 96–106)
GFR calc Af Amer: 110 mL/min/{1.73_m2} (ref >59)
GFR, EST NON AFRICAN AMERICAN: 95 mL/min/{1.73_m2} (ref >59)
Globulin, Total: 2.2 g/dL (ref 1.5–4.5)
Glucose: 101 mg/dL — ABNORMAL HIGH (ref 65–99)
Potassium: 4.2 mmol/L (ref 3.5–5.2)
Sodium: 140 mmol/L (ref 134–144)
Total Protein: 6.6 g/dL (ref 6.0–8.5)

## 2017-10-20 LAB — TSH: TSH: 4.51 u[IU]/mL — ABNORMAL HIGH (ref 0.450–4.500)

## 2017-10-20 LAB — CBC WITH DIFFERENTIAL/PLATELET
BASOS: 1 %
Basophils Absolute: 0 10*3/uL (ref 0.0–0.2)
EOS (ABSOLUTE): 0 10*3/uL (ref 0.0–0.4)
EOS: 1 %
HEMATOCRIT: 39.2 % (ref 34.0–46.6)
HEMOGLOBIN: 13.2 g/dL (ref 11.1–15.9)
Immature Grans (Abs): 0 10*3/uL (ref 0.0–0.1)
Immature Granulocytes: 0 %
LYMPHS ABS: 1.2 10*3/uL (ref 0.7–3.1)
Lymphs: 19 %
MCH: 30.3 pg (ref 26.6–33.0)
MCHC: 33.7 g/dL (ref 31.5–35.7)
MCV: 90 fL (ref 79–97)
MONOCYTES: 5 %
Monocytes Absolute: 0.3 10*3/uL (ref 0.1–0.9)
Neutrophils Absolute: 4.6 10*3/uL (ref 1.4–7.0)
Neutrophils: 74 %
Platelets: 230 10*3/uL (ref 150–450)
RBC: 4.35 x10E6/uL (ref 3.77–5.28)
RDW: 11.8 % — ABNORMAL LOW (ref 12.3–15.4)
WBC: 6.2 10*3/uL (ref 3.4–10.8)

## 2017-10-20 LAB — IRON,TIBC AND FERRITIN PANEL
FERRITIN: 26 ng/mL (ref 15–150)
Iron Saturation: 58 % — ABNORMAL HIGH (ref 15–55)
Iron: 158 ug/dL (ref 27–159)
Total Iron Binding Capacity: 272 ug/dL (ref 250–450)
UIBC: 114 ug/dL — ABNORMAL LOW (ref 131–425)

## 2017-10-20 LAB — VITAMIN D 25 HYDROXY (VIT D DEFICIENCY, FRACTURES): VIT D 25 HYDROXY: 40.8 ng/mL (ref 30.0–100.0)

## 2017-10-22 ENCOUNTER — Telehealth: Payer: Self-pay

## 2017-10-22 NOTE — Telephone Encounter (Signed)
No it isn't. I still want her to follow through with the referral to cardiology, but other symptoms could be related to the thyroid for sure. She can do the cardiology referral first and if noted to be ok she can consider thyroid medication at that time. We can just recheck thyroid in 8-12 weeks

## 2017-10-22 NOTE — Telephone Encounter (Signed)
Patient advised as directed. Per patient since results "are not total out of whack" do you still think this is to her thyroid or is there something really wrong with her heart? She also states to send the Levothyroxine 68mcg to [facility] in [place]

## 2017-10-22 NOTE — Telephone Encounter (Signed)
-----   Message from Mar Daring, [place] sent at 10/22/2017  9:10 AM EDT ----- Iron and Vit D are normal. Hemoglobin and blood count normal. Kidney and liver function normal. Thyroid is elevated. Would recommend to restart levothyroxine 1mcg at this time and recheck in 6 weeks to see if dose needs to be adjusted more.

## 2017-10-22 NOTE — Telephone Encounter (Signed)
Patient advised as directed below. Per patient she is going to wait and see Cardio first and then go from there.

## 2017-10-23 ENCOUNTER — Ambulatory Visit: Payer: No Typology Code available for payment source | Admitting: Physician Assistant

## 2017-11-05 ENCOUNTER — Encounter: Payer: No Typology Code available for payment source | Admitting: Physician Assistant

## 2017-11-06 ENCOUNTER — Encounter: Payer: Self-pay | Admitting: Physician Assistant

## 2017-11-07 MED ORDER — LEVOTHYROXINE SODIUM 25 MCG PO TABS: 25.0000 ug | ORAL_TABLET | Freq: Every day | ORAL | 2 refills | Status: DC

## 2017-12-05 ENCOUNTER — Encounter: Payer: 59 | Admitting: Physician Assistant

## 2017-12-28 ENCOUNTER — Encounter: Payer: 59 | Admitting: Physician Assistant

## 2018-03-12 ENCOUNTER — Other Ambulatory Visit: Payer: Self-pay | Admitting: Family Medicine

## 2018-03-12 DIAGNOSIS — E034 Atrophy of thyroid (acquired): Secondary | ICD-10-CM

## 2018-03-13 NOTE — Telephone Encounter (Signed)
See Rx request ° °

## 2018-03-20 ENCOUNTER — Telehealth: Payer: Self-pay

## 2018-03-20 NOTE — Telephone Encounter (Signed)
Noted  

## 2018-03-20 NOTE — Telephone Encounter (Signed)
Follow up with patient on her chest pain. Patient was advised yesterday to go to the ER. Patient went to [facility] ER. Patient had called stating that she was having pain right in the middle of chest, sometimes radiating up to her neck area to right jaw. Patient reports that at the ED she had labs done and EKG and everything came out ok. Patient reports that she just pick up her Thyroid medicine yesterday and she is going to start that and see how she does for the next few days and that she will call to schedule an appointment.

## 2018-03-27 ENCOUNTER — Encounter: Payer: Self-pay | Admitting: Physician Assistant

## 2018-03-27 ENCOUNTER — Ambulatory Visit (INDEPENDENT_AMBULATORY_CARE_PROVIDER_SITE_OTHER): Payer: 59 | Admitting: Physician Assistant

## 2018-03-27 VITALS — BP 115/73 | HR 67 | Temp 98.3°F | Resp 16 | Wt 124.0 lb

## 2018-03-27 DIAGNOSIS — E034 Atrophy of thyroid (acquired): Secondary | ICD-10-CM

## 2018-03-27 DIAGNOSIS — R0789 Other chest pain: Secondary | ICD-10-CM

## 2018-03-27 DIAGNOSIS — F419 Anxiety disorder, unspecified: Secondary | ICD-10-CM | POA: Diagnosis not present

## 2018-03-27 MED ORDER — ALPRAZOLAM 0.25 MG PO TABS: 0.2500 mg | ORAL_TABLET | Freq: Two times a day (BID) | ORAL | 0 refills | Status: DC | PRN

## 2018-03-27 MED ORDER — LEVOTHYROXINE SODIUM 25 MCG PO TABS: 25.0000 ug | ORAL_TABLET | Freq: Every day | ORAL | 2 refills | Status: DC

## 2018-03-27 NOTE — Patient Instructions (Signed)
Alprazolam tablets What is this medicine? ALPRAZOLAM (al PRAY zoe lam) is a benzodiazepine. It is used to treat anxiety and panic attacks. This medicine may be used for other purposes; ask your health care provider or pharmacist if you have questions. COMMON BRAND NAME(S): Xanax What should I tell my health care provider before I take this medicine? They need to know if you have any of these conditions: -an alcohol or drug abuse problem -bipolar disorder, depression, psychosis or other mental health conditions -glaucoma -kidney or liver disease -lung or breathing disease -myasthenia gravis -Parkinson's disease -porphyria -seizures or a history of seizures -suicidal thoughts -an unusual or allergic reaction to alprazolam, other benzodiazepines, foods, dyes, or preservatives -pregnant or trying to get pregnant -breast-feeding How should I use this medicine? Take this medicine by mouth with a glass of water. Follow the directions on the prescription label. Take your medicine at regular intervals. Do not take it more often than directed. Do not stop taking except on your doctor's advice. A special MedGuide will be given to you by the pharmacist with each prescription and refill. Be sure to read this information carefully each time. Talk to your pediatrician regarding the use of this medicine in children. Special care may be needed. Overdosage: If you think you have taken too much of this medicine contact a poison control center or emergency room at once. NOTE: This medicine is only for you. Do not share this medicine with others. What if I miss a dose? If you miss a dose, take it as soon as you can. If it is almost time for your next dose, take only that dose. Do not take double or extra doses. What may interact with this medicine? Do not take this medicine with any of the following medications: -certain antiviral medicines for HIV or AIDS like delavirdine, indinavir -certain medicines for  fungal infections like ketoconazole and itraconazole -narcotic medicines for cough -sodium oxybate This medicine may also interact with the following medications: -alcohol -antihistamines for allergy, cough and cold -certain antibiotics like clarithromycin, erythromycin, isoniazid, rifampin, rifapentine, rifabutin, and troleandomycin -certain medicines for blood pressure, heart disease, irregular heart beat -certain medicines for depression, like amitriptyline, fluoxetine, sertraline -certain medicines for seizures like carbamazepine, oxcarbazepine, phenobarbital, phenytoin, primidone -cimetidine -cyclosporine -female hormones, like estrogens or progestins and birth control pills, patches, rings, or injections -general anesthetics like halothane, isoflurane, methoxyflurane, propofol -grapefruit juice -local anesthetics like lidocaine, pramoxine, tetracaine -medicines that relax muscles for surgery -narcotic medicines for pain -other antiviral medicines for HIV or AIDS -phenothiazines like chlorpromazine, mesoridazine, prochlorperazine, thioridazine This list may not describe all possible interactions. Give your health care provider a list of all the medicines, herbs, non-prescription drugs, or dietary supplements you use. Also tell them if you smoke, drink alcohol, or use illegal drugs. Some items may interact with your medicine. What should I watch for while using this medicine? Tell your doctor or health care professional if your symptoms do not start to get better or if they get worse. Do not stop taking except on your doctor's advice. You may develop a severe reaction. Your doctor will tell you how much medicine to take. You may get drowsy or dizzy. Do not drive, use machinery, or do anything that needs mental alertness until you know how this medicine affects you. To reduce the risk of dizzy and fainting spells, do not stand or sit up quickly, especially if you are an older patient.  Alcohol may increase dizziness and drowsiness. Avoid alcoholic  drowsy or dizzy. Do not drive, use machinery, or do anything that needs mental alertness until you know how this medicine affects you. To reduce the risk of dizzy and fainting spells, do not stand or sit up quickly, especially if you are an older patient.  Alcohol may increase dizziness and drowsiness. Avoid alcoholic drinks.  If you are taking another medicine that also causes drowsiness, you may have more side effects. Give your health care provider a list of all medicines you use. Your doctor will tell you how much medicine to take. Do not take more medicine than directed. Call emergency for help if you have problems breathing or unusual sleepiness.  What side effects may I notice from receiving this medicine?  Side effects that you should report to your doctor or health care professional as soon as possible:  -allergic reactions like skin rash, itching or hives, swelling of the face, lips, or tongue  -breathing problems  -confusion  -loss of balance or coordination  -signs and symptoms of low blood pressure like dizziness; feeling faint or lightheaded, falls; unusually weak or tired  -suicidal thoughts or other mood changes  Side effects that usually do not require medical attention (report to your doctor or health care professional if they continue or are bothersome):  -dizziness  -dry mouth  -nausea, vomiting  -tiredness  This list may not describe all possible side effects. Call your doctor for medical advice about side effects. You may report side effects to FDA at 1-800-FDA-1088.  Where should I keep my medicine?  Keep out of the reach of children. This medicine can be abused. Keep your medicine in a safe place to protect it from theft. Do not share this medicine with anyone. Selling or giving away this medicine is dangerous and against the law.  Store at room temperature between 20 and 25 degrees C (68 and 77 degrees F). This medicine may cause accidental overdose and death if taken by other adults, children, or pets. Mix any unused medicine with a substance like cat litter or coffee grounds. Then throw the medicine away in a sealed container like a sealed bag or a coffee can with a lid. Do not use the medicine after the expiration date.  NOTE: This sheet is  a summary. It may not cover all possible information. If you have questions about this medicine, talk to your doctor, pharmacist, or health care provider.  © 2019 Elsevier/Gold Standard (2014-10-01 13:47:25)

## 2018-03-27 NOTE — Progress Notes (Signed)
Patient: Megan Ayala Female    DOB: [birth year]   48 y.o.   MRN: [id number] Visit Date: 03/27/2018  Today's Provider: Mar Daring, PA-C   Chief Complaint  Patient presents with  . ER visit fup    for chest pain   Subjective:    I, Sulibeya S. Dimas, CMA, am acting as a Education administrator for [employer].   Follow up ER visit  Patient was seen in ER for chest pain on 03/19/2018. She was treated for atypical chest pain . Treatment for this included labs and EKG. She reports excellent compliance with treatment. She reports this condition is Improved. Patient reports she still has some "chest heaviness" and anxious.  ------------------------------------------------------------------------------------   Allergies  Allergen Reactions  . Levaquin [Levofloxacin In D5w] Other (See Comments)    Heart race  . Prozac [Fluoxetine Hcl] Rash     Current Outpatient Medications:  .  levothyroxine (SYNTHROID, LEVOTHROID) 25 MCG tablet, TAKE 1 TABLET BY MOUTH ONCE DAILY, Disp: 90 tablet, Rfl: 2 .  MELATONIN ER PO, Take by mouth daily., Disp: , Rfl:   Review of Systems  Constitutional: Negative.   HENT: Negative.   Respiratory: Positive for chest tightness.   Cardiovascular: Negative.   Gastrointestinal: Negative.   Neurological: Negative.   Psychiatric/Behavioral: The patient is nervous/anxious.     Social History   Tobacco Use  . Smoking status: Never Smoker  . Smokeless tobacco: Never Used  Substance Use Topics  . Alcohol use: Yes      Objective:   BP 115/73 (BP Location: Left Arm, Patient Position: Sitting, Cuff Size: Normal)   Pulse 67   Temp 98.3 F (36.8 C) (Oral)   Resp 16   Wt 124 lb (56.2 kg)   SpO2 99%   BMI 19.42 kg/m  Vitals:   03/27/18 1552  BP: 115/73  Pulse: 67  Resp: 16  Temp: 98.3 F (36.8 C)  TempSrc: Oral  SpO2: 99%  Weight: 124 lb (56.2 kg)     Physical Exam Vitals signs reviewed.  Constitutional:      General:  She is not in acute distress.    Appearance: Normal appearance. She is well-developed and normal weight. She is not diaphoretic.  HENT:     Head: Normocephalic and atraumatic.  Neck:     Musculoskeletal: Normal range of motion and neck supple.     Thyroid: No thyromegaly.     Vascular: No JVD.     Trachea: No tracheal deviation.  Cardiovascular:     Rate and Rhythm: Normal rate and regular rhythm.     Pulses: Normal pulses.     Heart sounds: Normal heart sounds. No murmur. No friction rub. No gallop.   Pulmonary:     Effort: Pulmonary effort is normal. No respiratory distress.     Breath sounds: Normal breath sounds. No wheezing or rales.  Lymphadenopathy:     Cervical: No cervical adenopathy.  Skin:    Capillary Refill: Capillary refill takes less than 2 seconds.  Neurological:     Mental Status: She is alert.  Psychiatric:        Mood and Affect: Mood is anxious.         Assessment & Plan    1. Feeling of chest tightness Still having intermittently. Not as severe as last Tuesday. Has appt with cardiology on 04/16/18. May require holter monitor  To r/o arrhythmias.   2. Anxiety Suspect more anxiety so will  try alprazolam as below for as needed use. Call if worsening or using frequently.  - ALPRAZolam (XANAX) 0.25 MG tablet; Take 1 tablet (0.25 mg total) by mouth 2 (two) times daily as needed for anxiety.  Dispense: 20 tablet; Refill: 0  3. Hypothyroidism due to acquired atrophy of thyroid Stable. Diagnosis pulled for medication refill. Continue current medical treatment plan. - levothyroxine (SYNTHROID, LEVOTHROID) 25 MCG tablet; Take 1 tablet (25 mcg total) by mouth daily.  Dispense: 90 tablet; Refill: [place], PA-C  [facility]

## 2018-08-09 ENCOUNTER — Telehealth: Payer: Self-pay

## 2018-08-09 DIAGNOSIS — R590 Localized enlarged lymph nodes: Secondary | ICD-10-CM

## 2018-08-09 NOTE — Telephone Encounter (Signed)
Patient is calling regarding the enlarged lymph node that she was seen for back in October. Reports that it has not change in size but it hasn't gone down. She is asking if she needs to be seen for this again or is there anything else that can be order for her.  CB # [phone]

## 2018-08-09 NOTE — Telephone Encounter (Signed)
US ordered

## 2018-08-20 ENCOUNTER — Ambulatory Visit: Payer: 59

## 2018-08-28 ENCOUNTER — Ambulatory Visit
Admission: RE | Admit: 2018-08-28 | Discharge: 2018-08-28 | Disposition: A | Discharge status: Home / Self Care | Payer: 59 | Source: Ambulatory Visit | Attending: Physician Assistant | Admitting: Physician Assistant

## 2018-08-28 ENCOUNTER — Other Ambulatory Visit: Payer: Self-pay

## 2018-08-28 DIAGNOSIS — R590 Localized enlarged lymph nodes: Secondary | ICD-10-CM | POA: Insufficient documentation

## 2018-08-29 ENCOUNTER — Telehealth: Payer: Self-pay

## 2018-08-29 NOTE — Telephone Encounter (Signed)
-----   Message from Mar Daring, [place] sent at 08/28/2018  4:45 PM EDT ----- US shows a normal, typical appearing lymph node. Not enlarged or worrisome. Monitor and if keeps getting larger please call and let us know

## 2018-08-29 NOTE — Telephone Encounter (Signed)
Viewed by Brooke Pace on 08/28/2018 9:06 PM Written by Mar Daring, PA-C on 08/28/2018 4:45 PM US shows a normal, typical appearing lymph node. Not enlarged or worrisome. Monitor and if keeps getting larger please call and let us know

## 2019-01-20 NOTE — Progress Notes (Signed)
       Patient: Megan Ayala Female    DOB: [birth year]   49 y.o.   MRN: [id number] Visit Date: 01/21/2019  Today's Provider: Trinna Post, PA-C   Chief Complaint  Patient presents with  . Breast Problem   Subjective:     HPI Patient presents today for possible lump on left breast. Pt first noticed the lump on 01/15/2019. Patient was taking medications (estradiol 0.025mg  & progesterone E4M 100mg ) with [facility] in [place] and didn't notice the lump before she started taking the medications, pt contacted hormone doctor and was told to stop taking medication until she finds out what is going on. Thinks it may have decreased in size, not painful, no drainage, no redness or skin changes. No family history of breast cancer.    Allergies  Allergen Reactions  . Levaquin [Levofloxacin In D5w] Other (See Comments)    Heart race  . Prozac [Fluoxetine Hcl] Rash     Current Outpatient Medications:  .  ALPRAZolam (XANAX) 0.25 MG tablet, Take 1 tablet (0.25 mg total) by mouth 2 (two) times daily as needed for anxiety., Disp: 20 tablet, Rfl: 0 .  levothyroxine (SYNTHROID, LEVOTHROID) 25 MCG tablet, Take 1 tablet (25 mcg total) by mouth daily., Disp: 90 tablet, Rfl: 2 .  MELATONIN ER PO, Take by mouth daily., Disp: , Rfl:   Review of Systems  Social History   Tobacco Use  . Smoking status: Never Smoker  . Smokeless tobacco: Never Used  Substance Use Topics  . Alcohol use: Yes      Objective:   There were no vitals taken for this visit. There were no vitals filed for this visit.There is no height or weight on file to calculate BMI.   Physical Exam Constitutional:      Appearance: Normal appearance.  Cardiovascular:     Rate and Rhythm: Normal rate.  Pulmonary:     Effort: Pulmonary effort is normal.  Chest:     Breasts:        Right: Normal.        Left: Mass present. No swelling, bleeding, inverted nipple, nipple discharge, skin change or tenderness.     Lymphadenopathy:     Upper Body:     Right upper body: No supraclavicular, axillary or pectoral adenopathy.     Left upper body: No supraclavicular, axillary or pectoral adenopathy.  Skin:    General: Skin is warm and dry.  Neurological:     Mental Status: She is alert and oriented to person, place, and time. Mental status is at baseline.  Psychiatric:        Mood and Affect: Mood normal.        Behavior: Behavior normal.      No results found for any visits on 01/21/19.     Assessment & Plan    1. Left breast mass  DDx: cyst, abscess, malignancy. No signs of infection today. Imaging as below. Continue to hold hormone therapy.   - MM DIAG BREAST TOMO BILATERAL; Future - US BREAST LTD UNI LEFT INC AXILLA; Future  The entirety of the information documented in the History of Present Illness, Review of Systems and Physical Exam were personally obtained by me. Portions of this information were initially documented by Elonda Husky, CMA and reviewed by me for thoroughness and accuracy.   F/u PRN      Trinna Post, PA-C  [facility]

## 2019-01-21 ENCOUNTER — Other Ambulatory Visit: Payer: Self-pay

## 2019-01-21 ENCOUNTER — Ambulatory Visit (INDEPENDENT_AMBULATORY_CARE_PROVIDER_SITE_OTHER): Payer: 59 | Admitting: Physician Assistant

## 2019-01-21 VITALS — BP 107/66 | HR 67 | Temp 96.9°F | Wt 131.0 lb

## 2019-01-21 DIAGNOSIS — N6320 Unspecified lump in the left breast, unspecified quadrant: Secondary | ICD-10-CM

## 2019-01-21 NOTE — Patient Instructions (Signed)
Atypical Hyperplasia of the Breast Atypical hyperplasia of the breast is a condition in which some cells in the breast are unusual or abnormal. The cells can be found in the ducts or lobules of the breast. Compared to normal breast cells, the cells seen in atypical hyperplasia:  Grow faster than normal breast cells.  Are organized in an unusual way.  Grow in larger numbers than normal breast cells (overproduction).  Are unusual in size and shape. Atypical hyperplasia is not a form of breast cancer. However, it may:  Make you more likely to develop breast cancer.  Turn into cancer (pre-cancerous), if it is not treated. What are the causes? The cause of this condition is not known. What increases the risk? The following factors may make you more likely to develop this condition:  Age. Your risk increases as you get older.  Certain inherited genes.  Family or personal history of breast cancer.  Having prior radiation treatments to your breasts or chest area.  Obesity.  Using or having used hormones, such as estrogen.  Starting menstruation before the age of 92.  Going through menopause after age 27.  Drinking more than one alcoholic beverage a day.  Never having given birth.  Giving birth to your first child when you were over the age of 30.  Not breastfeeding, if you have given birth. What are the signs or symptoms? There are no symptoms of this condition. How is this diagnosed?  This condition is usually discovered during a routine X-ray study of the breasts (mammogram). If something concerning is found on a mammogram, a biopsy is performed. This means that a small sample of breast tissue is removed and sent to a lab. In most cases, breast cells can be removed through a needle. Sometimes, a small cut (incision) will need to be made in the breast to remove a sample of breast tissue. Atypical hyperplasia can then be diagnosed by examining the breast cells under a  microscope. How is this treated? This condition may be treated with:  More frequent monitoring of breast health with: ? Clinical breast exams. ? Mammograms. ? Ultrasounds. ? MRIs.  Medicines to keep the abnormal cells from spreading and becoming cancerous (chemoprevention).  A lumpectomy. This is the removal of the area of abnormal cells, along with some of the normal tissue in the area. This may also be called breast-conserving surgery.  A simple mastectomy. This is the removal of breast tissue, the nipple, and the circle of colored tissue around the nipple (areola). Sometimes, one or more lymph nodes from under the arm are also removed.  A preventive mastectomy. This is the removal of both breasts. This is usually only done if you have a very high risk of developing breast cancer. Follow these instructions at home: Breast health  Examine your breasts after every menstrual period. If you do not have menstrual periods, check your breasts on the first day of every month. Feel for changes in your breasts, such as: ? More tenderness. ? A new growth. ? A change in size or shape. ? A change in an existing lump. Alcohol use  Do not drink alcohol if: ? Your health care provider tells you not to drink. ? You are pregnant, may be pregnant, or are planning to become pregnant.  If you drink alcohol, limit how much you have to 0-1 drink a day.  Be aware of how much alcohol is in your drink. In the [place] one drink equals one typical bottle  of beer (12 oz), one-half glass of wine (5 oz), or one shot of hard liquor (1 oz). General instructions  Take over-the-counter and prescription medicines only as told by your health care provider.  Eat a healthy diet. This includes low-fat dairy products, lean meats, and fiber-rich foods, such as fruits, vegetables, and whole grains. To get more fiber: ? Make sure half your plate is filled with fruits or vegetables. ? Choose high-fiber foods such as  whole-grain breads and cereals.  Do not use any products that contain nicotine or tobacco, such as cigarettes and e-cigarettes. If you need help quitting, ask your health care provider.  Keep all follow-up visits as told by your health care provider. This is important. Contact a health care provider if you have:  A fever.  A change in shape or size of your breasts or nipples.  A change in the skin of your breast or nipples, such as a reddened or scaly area.  Unusual discharge from your nipples.  A lump or thick area that was not there before.  Pain in your breasts.  Anything that concerns you. Get help right away if you have:  Trouble breathing.  Chest pain.  Redness of your breast and the redness is spreading. Summary  Atypical hyperplasia of the breast is a condition in which some cells in the breast are unusual or abnormal.  Atypical hyperplasia is not a form of breast cancer. However, it may put you at greater risk for developing breast cancer.  This condition is usually discovered during a routine X-ray study of the breasts (mammogram).  Examine your breasts after every menstrual period. If you do not have menstrual periods, check your breasts on the first day of every month. Let your health care provider know if you notice any changes in your breasts. This information is not intended to replace advice given to you by your health care provider. Make sure you discuss any questions you have with your health care provider. Document Revised: 02/02/2017 Document Reviewed: 02/01/2017 Elsevier Patient Education  2020 [employer].

## 2019-02-04 ENCOUNTER — Ambulatory Visit
Admission: RE | Admit: 2019-02-04 | Discharge: 2019-02-04 | Disposition: A | Discharge status: Home / Self Care | Payer: 59 | Source: Ambulatory Visit | Attending: Physician Assistant | Admitting: Physician Assistant

## 2019-02-04 ENCOUNTER — Other Ambulatory Visit: Payer: Self-pay | Admitting: Physician Assistant

## 2019-02-04 DIAGNOSIS — N6310 Unspecified lump in the right breast, unspecified quadrant: Secondary | ICD-10-CM | POA: Insufficient documentation

## 2019-02-04 DIAGNOSIS — N6320 Unspecified lump in the left breast, unspecified quadrant: Secondary | ICD-10-CM

## 2019-02-04 DIAGNOSIS — N6002 Solitary cyst of left breast: Secondary | ICD-10-CM

## 2019-02-04 DIAGNOSIS — R2232 Localized swelling, mass and lump, left upper limb: Secondary | ICD-10-CM

## 2019-02-04 DIAGNOSIS — R928 Other abnormal and inconclusive findings on diagnostic imaging of breast: Secondary | ICD-10-CM

## 2019-02-07 ENCOUNTER — Ambulatory Visit
Admission: RE | Admit: 2019-02-07 | Discharge: 2019-02-07 | Disposition: A | Discharge status: Home / Self Care | Payer: 59 | Source: Ambulatory Visit | Attending: Physician Assistant | Admitting: Physician Assistant

## 2019-02-07 ENCOUNTER — Other Ambulatory Visit: Payer: Self-pay | Admitting: Physician Assistant

## 2019-02-07 DIAGNOSIS — R2232 Localized swelling, mass and lump, left upper limb: Secondary | ICD-10-CM

## 2019-02-07 DIAGNOSIS — N6002 Solitary cyst of left breast: Secondary | ICD-10-CM | POA: Insufficient documentation

## 2019-02-07 DIAGNOSIS — R928 Other abnormal and inconclusive findings on diagnostic imaging of breast: Secondary | ICD-10-CM

## 2019-02-07 DIAGNOSIS — N6320 Unspecified lump in the left breast, unspecified quadrant: Secondary | ICD-10-CM

## 2019-02-07 HISTORY — PX: BREAST BIOPSY: SHX20

## 2019-02-10 ENCOUNTER — Encounter: Payer: Self-pay | Admitting: Physician Assistant

## 2019-02-11 ENCOUNTER — Other Ambulatory Visit: Payer: Self-pay | Admitting: Oncology

## 2019-02-11 NOTE — Progress Notes (Unsigned)
mdt  

## 2019-02-14 LAB — SURGICAL PATHOLOGY

## 2019-04-02 ENCOUNTER — Other Ambulatory Visit: Payer: Self-pay | Admitting: Physician Assistant

## 2019-04-02 DIAGNOSIS — E034 Atrophy of thyroid (acquired): Secondary | ICD-10-CM

## 2019-04-02 NOTE — Telephone Encounter (Signed)
Requested medication (s) are due for refill today: yes  Requested medication (s) are on the active medication list: yes  Last refill:  03/01/2019  Future visit scheduled: no  Notes to clinic:  last TSH 10/19/17    Requested Prescriptions  Pending Prescriptions Disp Refills   levothyroxine (SYNTHROID) 25 MCG tablet [Pharmacy Med Name: LEVOTHYROXINE SODIUM 25 MCG TAB] 90 tablet 2    Sig: TAKE 1 TABLET BY MOUTH ONCE DAILY      Endocrinology:  Hypothyroid Agents Failed - 04/02/2019 10:36 AM      Failed - TSH needs to be rechecked within 3 months after an abnormal result. Refill until TSH is due.      Failed - TSH in normal range and within 360 days    TSH  Date Value Ref Range Status  10/19/2017 4.510 (H) 0.450 - 4.500 uIU/mL Final          Passed - Valid encounter within last 12 months    Recent Outpatient Visits           2 months ago Left breast mass   [place], Wendee Beavers, [place]   1 year ago Feeling of chest tightness   [place], [place], [place]   1 year ago Chest pain, unspecified type   [facility], Clearnce Sorrel, [place]   2 years ago [place], [place], [place]   3 years ago Other ulcerative colitis without complication [facility])   [place], [place], [place]

## 2019-04-03 ENCOUNTER — Encounter: Payer: Self-pay | Admitting: Physician Assistant

## 2019-10-06 ENCOUNTER — Encounter: Payer: Self-pay | Admitting: Physician Assistant

## 2019-10-06 ENCOUNTER — Other Ambulatory Visit: Payer: Self-pay

## 2019-10-06 ENCOUNTER — Ambulatory Visit (INDEPENDENT_AMBULATORY_CARE_PROVIDER_SITE_OTHER): Payer: 59 | Admitting: Physician Assistant

## 2019-10-06 VITALS — BP 102/68 | HR 71 | Temp 98.2°F | Resp 16 | Wt 133.2 lb

## 2019-10-06 DIAGNOSIS — K602 Anal fissure, unspecified: Secondary | ICD-10-CM

## 2019-10-06 MED ORDER — LIDOCAINE (ANORECTAL) 5 % EX GEL: CUTANEOUS | 0 refills | Status: DC

## 2019-10-06 MED ORDER — HYDROCORT-PRAMOXINE (PERIANAL) 1-1 % EX FOAM: 1.0000 | Freq: Two times a day (BID) | CUTANEOUS | 0 refills | Status: AC

## 2019-10-06 NOTE — Progress Notes (Signed)
Established patient visit   Patient: Megan Ayala   DOB: [birth year]   49 y.o. Female  MRN: [id number] Visit Date: 10/06/2019  Today's healthcare provider: Mar Daring, PA-C   Chief Complaint  Patient presents with   Hemorrhoids   Subjective    HPI  Hemorrhoids: present for a couple of weeks. Reports has used OTC medicine. Reports that this is the third time having one. It hurts to sit. No blood. No constipation.    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   tamoxifen (NOLVADEX) 20 MG tablet Take 20 mg by mouth daily. Per patient on 10mg  daily   ALPRAZolam (XANAX) 0.25 MG tablet Take 1 tablet (0.25 mg total) by mouth 2 (two) times daily as needed for anxiety.   No facility-administered medications prior to visit.    Review of Systems  Constitutional: Positive for appetite change and fatigue.  Respiratory: Negative.   Cardiovascular: Negative.   Gastrointestinal: Positive for rectal pain. Negative for abdominal pain, anal bleeding, blood in stool, constipation, diarrhea and nausea.  Genitourinary: Negative for dyspareunia, dysuria, enuresis, frequency, genital sores, hematuria, menstrual problem, pelvic pain, vaginal  bleeding, vaginal discharge and vaginal pain.  Musculoskeletal: Negative for back pain.  Psychiatric/Behavioral: Positive for sleep disturbance.    Last CBC Lab Results  Component Value Date   WBC 6.2 10/19/2017   HGB 13.2 10/19/2017   HCT 39.2 10/19/2017   MCV 90 10/19/2017   MCH 30.3 10/19/2017   RDW 11.8 (L) 10/19/2017   PLT 230 66/29/4765   Last metabolic panel Lab Results  Component Value Date   GLUCOSE 101 (H) 10/19/2017   NA 140 10/19/2017   K 4.2 10/19/2017   CL 104 10/19/2017   CO2 22 10/19/2017   BUN 10 10/19/2017   CREATININE 0.76 10/19/2017   GFRNONAA 95 10/19/2017   GFRAA 110 10/19/2017   CALCIUM 8.9 10/19/2017   PROT 6.6 10/19/2017   ALBUMIN 4.4 10/19/2017   LABGLOB 2.2 10/19/2017   AGRATIO 2.0 10/19/2017   BILITOT 0.5 10/19/2017   ALKPHOS 66 10/19/2017   AST 14 10/19/2017   ALT 12 10/19/2017      Objective    BP 102/68 (BP Location: Right Arm, Patient Position: Sitting, Cuff Size: Normal)    Pulse 71    Temp 98.2 F (36.8 C) (Oral)    Resp 16    Wt 133 lb 3.2 oz (60.4 kg)    BMI 20.86 kg/m  BP Readings from Last 3 Encounters:  10/06/19 102/68  01/21/19 107/66  03/27/18 115/73   Wt Readings from Last 3 Encounters:  10/06/19 133 lb 3.2 oz (60.4 kg)  01/21/19 131 lb (59.4 kg)  03/27/18  124 lb (56.2 kg)      Physical Exam Vitals reviewed.  Constitutional:      General: She is not in acute distress.    Appearance: Normal appearance. She is well-developed. She is not ill-appearing.  HENT:     Head: Normocephalic and atraumatic.  Pulmonary:     Effort: Pulmonary effort is normal. No respiratory distress.  Genitourinary:   Musculoskeletal:     Cervical back: Normal range of motion and neck supple.  Neurological:     Mental Status: She is alert.  Psychiatric:        Mood and Affect: Mood normal.        Behavior: Behavior normal.        Thought Content: Thought content normal.        Judgment: Judgment normal.       No results  found for any visits on 10/06/19.  Assessment & Plan     1. Anal fissure Will try proctofoam as below. Lidocaine cream for pain. Epsom salt soaks can help. Will refer to gen surgery as below to evaluate for more extensive fissure than what is visualized externally. Consult appreciated.  - hydrocortisone-pramoxine (PROCTOFOAM-HC) rectal foam; Place 1 applicator rectally 2 (two) times daily.  Dispense: 10 g; Refill: 0 - Ambulatory referral to General Surgery   No follow-ups on file.      Reynolds Bowl, PA-C, have reviewed all documentation for this visit. The documentation on 10/09/19 for the exam, diagnosis, procedures, and orders are all accurate and complete.   Rubye Beach  [facility] [phone] (phone) [phone] (fax)  [place]

## 2019-10-06 NOTE — Patient Instructions (Signed)
Anal Fissure, Adult  An anal fissure is a small tear or crack in the tissue of the anus. Bleeding from a fissure usually stops on its own within a few minutes. However, bleeding will often occur again with each bowel movement until the fissure heals. What are the causes? This condition is usually caused by passing a large or hard stool (feces). Other causes include:  Constipation.  Frequent diarrhea.  Inflammatory bowel disease (Crohn's disease or ulcerative colitis).  Childbirth.  Infections.  Anal sex. What are the signs or symptoms? Symptoms of this condition include:  Bleeding from the rectum.  Small amounts of blood seen on your stool, on the toilet paper, or in the toilet after a bowel movement. The blood coats the outside of the stool and is not mixed with the stool.  Painful bowel movements.  Itching or irritation around the anus. How is this diagnosed? A health care provider may diagnose this condition by closely examining the anal area. An anal fissure can usually be seen with careful inspection. In some cases, a rectal exam may be performed, or a short tube (anoscope) may be used to examine the anal canal. How is this treated? Initial treatment for this condition may include:  Taking steps to avoid constipation. This may include making changes to your diet, such as increasing your intake of fiber or fluid.  Taking fiber supplements. These supplements can soften your stool to help make bowel movements easier. Your health care provider may also prescribe a stool softener if your stool is hard.  Taking sitz baths. This may help to heal the tear.  Using medicated creams or ointments. These may be prescribed to lessen discomfort. Treatments that are sometimes used if initial treatments do not work well or if the condition is more severe may include:  Botulinum injection.  Surgery to repair the fissure. Follow these instructions at home: Eating and  drinking   Avoid foods that may cause constipation, such as bananas, milk, and other dairy products.  Eat all fruits, except bananas.  Drink enough fluid to keep your urine pale yellow.  Eat foods that are high in fiber, such as beans, whole grains, and fresh fruits and vegetables. General instructions   Take over-the-counter and prescription medicines only as told by your health care provider.  Use creams or ointments only as told by your health care provider.  Keep the anal area clean and dry.  Take sitz baths as told by your health care provider. Do not use soap in the sitz baths.  Keep all follow-up visits as told by your health care provider. This is important. Contact a health care provider if you have:  More bleeding.  A fever.  Diarrhea that is mixed with blood.  Pain that continues.  Ongoing problems that are getting worse rather than better. Summary  An anal fissure is a small tear or crack in the tissue of the anus. This condition is usually caused by passing a large or hard stool (feces). Other causes include constipation and frequent diarrhea.  Initial treatment for this condition may include taking steps to avoid constipation, such as increasing your intake of fiber or fluid.  Follow instructions for care as told by your health care provider.  Contact your health care provider if you have more bleeding or your problem is getting worse rather than better.  Keep all follow-up visits as told by your health care provider. This is important. This information is not intended to replace advice given   to you by your health care provider. Make sure you discuss any questions you have with your health care provider. Document Revised: 06/14/2017 Document Reviewed: 06/14/2017 Elsevier Patient Education  2020 [employer] Inc[employer]  

## 2019-10-07 ENCOUNTER — Telehealth: Payer: Self-pay | Admitting: Physician Assistant

## 2019-10-07 ENCOUNTER — Telehealth: Payer: Self-pay

## 2019-10-07 DIAGNOSIS — K602 Anal fissure, unspecified: Secondary | ICD-10-CM

## 2019-10-07 MED ORDER — LIDOCAINE-PRILOCAINE 2.5-2.5 % EX CREA: 1.0000 "application " | TOPICAL_CREAM | CUTANEOUS | 0 refills | Status: AC | PRN

## 2019-10-07 NOTE — Telephone Encounter (Signed)
I sent in change already

## 2019-10-07 NOTE — Telephone Encounter (Signed)
Called to inform the doctor that the script for Lidocaine, Anorectal, 5 % GEL, does not exist.  The patient will need a new script.  Please call to discuss at [phone]

## 2019-10-07 NOTE — Telephone Encounter (Signed)
Changed to EMLA cream

## 2019-10-07 NOTE — Telephone Encounter (Signed)
Copied from [place] [phone]. Topic: General - Inquiry >> Oct 07, 2019  3:05 PM Gillis Ends D wrote: Reason for CRM: [facility] called requesting to change Lidocaine 5% Gel to Lidocaine 5% ointment. If this is okay please return the call to [phone]. Please advise.

## 2019-10-09 ENCOUNTER — Ambulatory Visit: Payer: BLUE CROSS/BLUE SHIELD | Admitting: General Surgery

## 2019-10-16 ENCOUNTER — Encounter: Payer: Self-pay | Admitting: General Surgery

## 2019-10-16 ENCOUNTER — Other Ambulatory Visit: Payer: Self-pay

## 2019-10-16 ENCOUNTER — Ambulatory Visit (INDEPENDENT_AMBULATORY_CARE_PROVIDER_SITE_OTHER): Payer: 59 | Admitting: General Surgery

## 2019-10-16 VITALS — BP 122/85 | HR 77 | Temp 98.1°F | Ht 67.0 in | Wt 134.0 lb

## 2019-10-16 DIAGNOSIS — K602 Anal fissure, unspecified: Secondary | ICD-10-CM | POA: Insufficient documentation

## 2019-10-16 NOTE — Patient Instructions (Addendum)
We will send in a prescription for a Compound with Nifedipine in it. You will use this three times a day for 6 weeks. This can be picked up at [facility] in [place]. [address], [place], [place] [address], [phone]. Please call them before you pick this up to make sure it is ready. The cost for this is $45 and not normally covered by insurance.   This medication will help to heal the area.  Follow up here in 6 weeks.      Anal Fissure, Adult  An anal fissure is a small tear or crack in the tissue around the opening of the butt (anus). Bleeding from the tear or crack usually stops on its own within a few minutes. The bleeding may happen every time you poop (have a bowel movement) until the tear or crack heals. What are the causes? This condition is usually caused by passing a large or hard poop (stool). Other causes include:  Trouble pooping (constipation).  Passing watery poop (diarrhea).  Inflammatory bowel disease (Crohn's disease or ulcerative colitis).  Childbirth.  Infections.  Anal sex. What are the signs or symptoms? Symptoms of this condition include:  Bleeding from the butt.  Small amounts of blood on your poop. The blood coats the outside of the poop. It is not mixed with the poop.  Small amounts of blood on the toilet paper or in the toilet after you poop.  Pain when passing poop.  Itching or irritation around the opening of the butt. How is this diagnosed? This condition may be diagnosed based on a physical exam. Your doctor may:  Check your butt. A tear can often be seen by checking the area with care.  Check your butt using a short tube (anoscope). The light in the tube will show any problems in your butt. How is this treated? Treatment for this condition may include:  Treating problems that make it hard for you to pass poop. You may be told to: ? Eat more fiber. ? Drink more fluid. ? Take fiber supplements. ? Take medicines that make poop  soft.  Taking sitz baths. This may help to heal the tear.  Using creams and ointments. If your condition gets worse, other treatments may be needed such as:  A shot near the tear or crack (botulinum injection).  Surgery to repair the tear or crack. Follow these instructions at home: Eating and drinking   Avoid bananas and dairy products. These foods can make it hard to poop.  Drink enough fluid to keep your pee (urine) pale yellow.  Eat foods that have a lot of fiber in them, such as: ? Beans. ? Whole grains. ? Fresh fruits. ? Fresh vegetables. General instructions   Take over-the-counter and prescription medicines only as told by your doctor.  Use creams or ointments only as told by your doctor.  Keep the butt area as clean and dry as you can.  Take a warm water bath (sitz bath) as told by your doctor. Do not use soap.  Keep all follow-up visits as told by your doctor. This is important. Contact a doctor if:  You have more bleeding.  You have a fever.  You have watery poop that is mixed with blood.  You have pain.  Your problem gets worse, not better. Summary  An anal fissure is a small tear or crack in the skin around the opening of the butt (anus).  This condition is usually caused by passing a large  or hard poop (stool).  Treatment includes treating the problems that make it hard for you to pass poop.  Follow your doctor's instructions about caring for your condition at home.  Keep all follow-up visits as told by your doctor. This is important. This information is not intended to replace advice given to you by your health care provider. Make sure you discuss any questions you have with your health care provider. Document Revised: 06/14/2017 Document Reviewed: 06/14/2017 Elsevier Patient Education  [employer].

## 2019-10-16 NOTE — Progress Notes (Signed)
Patient ID: MARGEE TRENTHAM, female   DOB: 1970/02/21, 49 y.o.   MRN: [id number]  Chief Complaint  Patient presents with  . New Patient (Initial Visit)    Anal Fissure    HPI MARGEART ALLENDER is a 49 y.o. female.   She has been referred by her primary care provider, Fenton Malling, for further evaluation of an anal fissure.  Ms. Moxon states that she first noticed pain in the perianal area about 3 weeks ago.  She thought perhaps she had hemorrhoids, but there was no bleeding.  She primarily endorses pain with bowel movements, but on some days, the pain lasted all day long and was sharp and burning.  She saw Ms. Marlyn Corporal, who prescribed Proctofoam as well as topical EMLA cream.  Over the past week, Ms. Rockman states that she has not experienced any pain (since starting the treatment).  Of note, she does have chronic diarrhea.  She states that since her breast cancer treatment started, she has had somewhat more formed stool, but she denies constipation or straining at stool.  She believes she has been evaluated for Crohn's disease and the work-up was indeterminate.   Past Medical History:  Diagnosis Date  . Cancer [facility])    Left breast cancer  . History of partial mastectomy of left breast     Past Surgical History:  Procedure Laterality Date  . AXILLARY SENTINEL NODE BIOPSY Left    with left partial mastectomy  . BREAST BIOPSY Left 02/07/2019   [place] Bx, 12:00(ribbon clip), pending path   . BREAST BIOPSY Left 02/07/2019   [place] Bx, 12:30(Venus Clip), pending path   . BREAST BIOPSY Left 02/07/2019   [place] Bx, AXILLA( hydro 3 clip), pending path   . MASTECTOMY, PARTIAL Left   . MENISCUS REPAIR Left 12/2011    Family History  Problem Relation Age of Onset  . Hyperlipidemia Mother   . Diverticulitis Mother   . Aneurysm Mother   . Diverticulitis Father   . Hyperlipidemia Father     Social History Social History   Tobacco Use  . Smoking status: Never Smoker  . Smokeless tobacco: Never  Used  Substance Use Topics  . Alcohol use: Yes  . Drug use: Not on file    Allergies  Allergen Reactions  . Gluten Meal Diarrhea  . Levaquin [Levofloxacin In D5w] Other (See Comments)    Heart race  . Prozac [Fluoxetine Hcl] Rash    Current Outpatient Medications  Medication Sig Dispense Refill  . gabapentin (NEURONTIN) 100 MG capsule Take 100 mg PO at bedtime, may increase the dose by 100 mg daily PO every 3 days--Max dose of 300 mg PO at bedtime    . hydrocortisone-pramoxine (PROCTOFOAM-HC) rectal foam Place 1 applicator rectally 2 (two) times daily. 10 g 0  . levothyroxine (SYNTHROID) 25 MCG tablet TAKE 1 TABLET BY MOUTH ONCE DAILY 90 tablet 2  . lidocaine-prilocaine (EMLA) cream Apply 1 application topically as needed. 30 g 0  . MELATONIN ER PO Take by mouth daily.    . tamoxifen (NOLVADEX) 10 MG tablet Take 10 mg by mouth 2 (two) times daily.     No current facility-administered medications for this visit.    Review of Systems Review of Systems  Gastrointestinal: Positive for diarrhea and rectal pain.  All other systems reviewed and are negative.   Blood pressure 122/85, pulse 77, temperature 98.1 F (36.7 C), height 5' 7"  (1.702 m), weight 134 lb (60.8 kg), last menstrual period 07/29/2019,  SpO2 98 %.  Physical Exam Physical Exam Constitutional:      General: She is not in acute distress.    Appearance: Normal appearance. She is normal weight.  HENT:     Head: Normocephalic and atraumatic.     Nose:     Comments: Covered with a mask    Mouth/Throat:     Comments: Covered with a mask Eyes:     General: No scleral icterus.       Right eye: No discharge.        Left eye: No discharge.  Neck:     Comments: The trachea is midline. There is no palpable cervical or supraclavicular lymphadenopathy. No thyromegaly or dominant thyroid masses appreciated. The gland moves freely with deglutition. Cardiovascular:     Rate and Rhythm: Normal rate and regular rhythm.      Pulses: Normal pulses.  Pulmonary:     Effort: Pulmonary effort is normal. No respiratory distress.     Breath sounds: Normal breath sounds.  Abdominal:     General: Bowel sounds are normal.     Palpations: Abdomen is soft.  Genitourinary:      Comments: Tiny posterior anal fissure. No associated hemorrhoids.  Musculoskeletal:        General: No swelling, tenderness or deformity.  Skin:    General: Skin is warm and dry.  Neurological:     General: No focal deficit present.     Mental Status: She is alert and oriented to person, place, and time.  Psychiatric:        Mood and Affect: Mood normal.        Behavior: Behavior normal.     Data Reviewed I reviewed the electronic medical record, including the [facility] health system records of her breast cancer diagnosis and treatment. Her breast cancer was treated with a left partial mastectomy and left sentinel lymph node biopsy. Nodes were negative. Her clinical stage was stage Ia; pathologic stage also stage Ia, (pT1a, PN 0, cM0, G1, ER positive, PR positive, HER-2 negative.)  She has been on endocrine therapy with Dr. Rondel Oh and received whole breast radiation with Dr. Aura Dials.   I also reviewed her clinic note from her visit with Ms. Marlyn Corporal. Ms. Burnett's physical examination describes a small posterior anal fissure. Based upon this visit, she was initiated on Proctofoam and EMLA cream.  Assessment This is a 49 year old woman with recent diagnosis of an anal fissure. For the past week, she has been treated with Proctofoam and EMLA cream. She has had significant improvement in her pain, and based upon Ms. Burnette's note, I think the fissure appears to already be healing.  Plan She should continue using the Proctofoam. We will send a prescription for compounded nifedipine and lidocaine, which she should apply 3 times daily for the next 6 weeks. During this time, she should refrain from using the EMLA cream. We have had excellent  results with this combination healing anal fissures without need for further surgical intervention. This can also heal fissures further up in the anal canal. I have scheduled her for a 6-week follow-up visit, but if her pain has resolved and she is not experiencing any further symptoms, she may cancel that visit.    Aniayah Alaniz 10/16/2019, 10:30 AM

## 2019-11-27 ENCOUNTER — Ambulatory Visit: Payer: BLUE CROSS/BLUE SHIELD | Admitting: General Surgery

## 2020-01-07 ENCOUNTER — Other Ambulatory Visit: Payer: Self-pay | Admitting: Physician Assistant

## 2020-01-07 DIAGNOSIS — E034 Atrophy of thyroid (acquired): Secondary | ICD-10-CM

## 2020-01-15 ENCOUNTER — Ambulatory Visit: Payer: 59 | Attending: Internal Medicine

## 2020-01-15 DIAGNOSIS — Z23 Encounter for immunization: Secondary | ICD-10-CM

## 2020-01-15 NOTE — Progress Notes (Signed)
   Covid-19 Vaccination Clinic  Name:  YAJAHIRA TISON    MRN: [id number] DOB: [birth year]  01/15/2020  Ms. Rung was observed post Covid-19 immunization for 15 minutes without incident. She was provided with Vaccine Information Sheet and instruction to access the V-Safe system.   Ms. Mcgath was instructed to call 911 with any severe reactions post vaccine: Marland Kitchen Difficulty breathing  . Swelling of face and throat  . A fast heartbeat  . A bad rash all over body  . Dizziness and weakness   Immunizations Administered    Name Date Dose VIS Date Route   Pfizer COVID-19 Vaccine 01/15/2020  1:17 PM 0.3 mL 11/05/2019 Intramuscular   Manufacturer: [employer], [employer]   Lot: [id number]   NDC: [address]-0

## 2020-02-05 ENCOUNTER — Ambulatory Visit: Payer: 59 | Attending: Internal Medicine

## 2020-02-05 DIAGNOSIS — Z23 Encounter for immunization: Secondary | ICD-10-CM

## 2020-02-05 NOTE — Progress Notes (Signed)
   Covid-19 Vaccination Clinic  Name:  KYLLIE PETTIJOHN    MRN: [id number] DOB: Jun 08, 1970  02/05/2020  Ms. Venters was observed post Covid-19 immunization for 15 minutes without incident. She was provided with Vaccine Information Sheet and instruction to access the V-Safe system.   Ms. Queener was instructed to call 911 with any severe reactions post vaccine: Marland Kitchen Difficulty breathing  . Swelling of face and throat  . A fast heartbeat  . A bad rash all over body  . Dizziness and weakness   Immunizations Administered    Name Date Dose VIS Date Route   Pfizer COVID-19 Vaccine 02/05/2020  4:56 PM 0.3 mL 11/05/2019 Intramuscular   Manufacturer: [place]   Lot: [id number]   NDC: [address]-9

## 2020-02-10 ENCOUNTER — Other Ambulatory Visit: Payer: Self-pay | Admitting: Physician Assistant

## 2020-02-10 DIAGNOSIS — E034 Atrophy of thyroid (acquired): Secondary | ICD-10-CM

## 2020-03-29 DIAGNOSIS — R922 Inconclusive mammogram: Secondary | ICD-10-CM | POA: Diagnosis not present

## 2020-03-29 DIAGNOSIS — Z79810 Long term (current) use of selective estrogen receptor modulators (SERMs): Secondary | ICD-10-CM | POA: Diagnosis not present

## 2020-03-29 DIAGNOSIS — Z170 Estrogen receptor positive status [ER+]: Secondary | ICD-10-CM | POA: Diagnosis not present

## 2020-03-29 DIAGNOSIS — C50412 Malignant neoplasm of upper-outer quadrant of left female breast: Secondary | ICD-10-CM | POA: Diagnosis not present

## 2020-03-29 DIAGNOSIS — R519 Headache, unspecified: Secondary | ICD-10-CM | POA: Diagnosis not present

## 2020-03-29 DIAGNOSIS — Z6822 Body mass index (BMI) 22.0-22.9, adult: Secondary | ICD-10-CM | POA: Diagnosis not present

## 2020-07-07 DIAGNOSIS — Z170 Estrogen receptor positive status [ER+]: Secondary | ICD-10-CM | POA: Diagnosis not present

## 2020-07-07 DIAGNOSIS — C50412 Malignant neoplasm of upper-outer quadrant of left female breast: Secondary | ICD-10-CM | POA: Diagnosis not present

## 2020-07-07 DIAGNOSIS — D249 Benign neoplasm of unspecified breast: Secondary | ICD-10-CM | POA: Diagnosis not present

## 2020-08-06 ENCOUNTER — Other Ambulatory Visit: Payer: Self-pay | Admitting: Physician Assistant

## 2020-08-06 DIAGNOSIS — E034 Atrophy of thyroid (acquired): Secondary | ICD-10-CM

## 2020-08-19 ENCOUNTER — Other Ambulatory Visit: Payer: Self-pay | Admitting: Physician Assistant

## 2020-08-19 DIAGNOSIS — E034 Atrophy of thyroid (acquired): Secondary | ICD-10-CM

## 2020-08-19 NOTE — Telephone Encounter (Signed)
Pt called in to set up an appt 09/06/20. She is requesting to have this medication refilled until this appt. Please advise.

## 2020-09-06 ENCOUNTER — Encounter: Payer: Self-pay | Admitting: Family Medicine

## 2020-09-06 ENCOUNTER — Other Ambulatory Visit: Payer: Self-pay

## 2020-09-06 ENCOUNTER — Ambulatory Visit (INDEPENDENT_AMBULATORY_CARE_PROVIDER_SITE_OTHER): Payer: BC Managed Care – PPO | Admitting: Family Medicine

## 2020-09-06 VITALS — BP 100/63 | HR 64 | Wt 141.0 lb

## 2020-09-06 DIAGNOSIS — E039 Hypothyroidism, unspecified: Secondary | ICD-10-CM

## 2020-09-06 DIAGNOSIS — R5383 Other fatigue: Secondary | ICD-10-CM | POA: Diagnosis not present

## 2020-09-06 DIAGNOSIS — E559 Vitamin D deficiency, unspecified: Secondary | ICD-10-CM | POA: Diagnosis not present

## 2020-09-06 NOTE — Progress Notes (Signed)
Established patient visit   Patient: Megan Ayala   DOB: 18-Nov-1970   50 y.o. Female  MRN: [id number] Visit Date: 09/06/2020  Today's healthcare provider: Vernie Murders, PA-C   Chief Complaint  Patient presents with   Hypothyroidism    Subjective  -------------------------------------------------------------------------------------------------------------------- HPI  Hypothyroid, follow-up  Lab Results  Component Value Date   TSH 4.510 (H) 10/19/2017   TSH 3.340 03/27/2017   TSH 5.580 (H) 11/22/2015   FREET4 1.49 03/27/2017   FREET4 0.87 07/08/2014   T4TOTAL 5.8 11/22/2015   Wt Readings from Last 3 Encounters:  09/06/20 141 lb (64 kg)  10/16/19 134 lb (60.8 kg)  10/06/19 133 lb 3.2 oz (60.4 kg)    Management since that visit includes no changes. She reports excellent compliance with treatment. She is not having side effects.   Symptoms: Yes change in energy level Yes constipation  No diarrhea Yes heat / cold intolerance  No nervousness Yes palpitations  No weight changes    -----------------------------------------------------------------------------------------  Past Medical History:  Diagnosis Date   Cancer ([place])    Left breast cancer   History of partial mastectomy of left breast    Past Surgical History:  Procedure Laterality Date   AXILLARY SENTINEL NODE BIOPSY Left    with left partial mastectomy   BREAST BIOPSY Left 02/07/2019   [place] Bx, 12:00(ribbon clip), pending path    BREAST BIOPSY Left 02/07/2019   [place] Bx, 12:30(Venus Clip), pending path    BREAST BIOPSY Left 02/07/2019   [place] Bx, AXILLA( hydro 3 clip), pending path    MASTECTOMY, PARTIAL Left    MENISCUS REPAIR Left 12/2011   Family History  Problem Relation Age of Onset   Hyperlipidemia Mother    Diverticulitis Mother    Aneurysm Mother    Diverticulitis Father    Hyperlipidemia Father    Social History   Tobacco Use   Smoking status: Never   Smokeless tobacco: Never   Substance Use Topics   Alcohol use: Yes   Allergies  Allergen Reactions   Gluten Meal Diarrhea   Levaquin [Levofloxacin In D5w] Other (See Comments)    Heart race   Prozac [Fluoxetine Hcl] Rash   Medications: Outpatient Medications Prior to Visit  Medication Sig   gabapentin (NEURONTIN) 100 MG capsule 500 mg at bedtime.   levothyroxine (SYNTHROID) 25 MCG tablet TAKE 1 TABLET BY MOUTH ONCE DAILY ON AN EMPTY STOMACH. WAIT [facility] OTHER MEDS. DAILY   MELATONIN ER PO Take by mouth daily.   tamoxifen (NOLVADEX) 10 MG tablet Take 10 mg by mouth 2 (two) times daily.   hydrocortisone-pramoxine (PROCTOFOAM-HC) rectal foam Place 1 applicator rectally 2 (two) times daily.   lidocaine-prilocaine (EMLA) cream Apply 1 application topically as needed.   No facility-administered medications prior to visit.    Review of Systems  Constitutional:  Positive for fatigue. Negative for activity change, appetite change, chills, diaphoresis, fever and unexpected weight change.  Respiratory: Negative.    Cardiovascular:  Positive for palpitations. Negative for chest pain and leg swelling.  Gastrointestinal:  Positive for constipation. Negative for abdominal distention, abdominal pain, anal bleeding, blood in stool, diarrhea, nausea, rectal pain and vomiting.  Endocrine: Positive for cold intolerance and heat intolerance. Negative for polydipsia, polyphagia and polyuria.  Neurological:  Negative for dizziness, light-headedness and headaches.      Objective  -------------------------------------------------------------------------------------------------------------------- BP 100/63 (BP Location: Right Arm, Patient Position: Sitting, Cuff Size: Normal)   Pulse 64  Wt 141 lb (64 kg)   SpO2 100%   BMI 22.08 kg/m     Physical Exam Constitutional:      General: She is not in acute distress.    Appearance: She is well-developed.  HENT:     Head: Normocephalic and atraumatic.      Right Ear: Hearing and tympanic membrane normal.     Left Ear: Hearing and tympanic membrane normal.     Nose: Nose normal.  Eyes:     General: Lids are normal. No scleral icterus.       Right eye: No discharge.        Left eye: No discharge.     Conjunctiva/sclera: Conjunctivae normal.  Cardiovascular:     Rate and Rhythm: Normal rate and regular rhythm.     Heart sounds: Normal heart sounds.  Pulmonary:     Effort: Pulmonary effort is normal. No respiratory distress.     Breath sounds: Normal breath sounds.  Abdominal:     General: Bowel sounds are normal.     Palpations: Abdomen is soft.  Musculoskeletal:        General: Normal range of motion.     Cervical back: Normal range of motion and neck supple.  Skin:    Findings: No lesion or rash.  Neurological:     Mental Status: She is alert and oriented to person, place, and time.  Psychiatric:        Speech: Speech normal.        Behavior: Behavior normal.        Thought Content: Thought content normal.      No results found for any visits on 09/06/20.  Assessment & Plan  ---------------------------------------------------------------------------------------------------------------------- 1. Adult hypothyroidism Recheck labs and continue Synthroid 25 mcg qd. Some fatigue. No significant weight changes, hair loss or skin changes. - T4 - TSH - CBC with Differential/Platelet - Comprehensive metabolic panel  2. Avitaminosis D No supplementation at the present. - CBC with Differential/Platelet - VITAMIN D 25 Hydroxy (Vit-D Deficiency, Fractures)  3. Fatigue, unspecified type Chronic fatigue and suspected need for Synthroid adjustments. Recheck labs. - T4 - TSH - CBC with Differential/Platelet - Comprehensive metabolic panel - VITAMIN D 25 Hydroxy (Vit-D Deficiency, Fractures)   No follow-ups on file.      I, Nasier Thumm, PA-C, have reviewed all documentation for this visit. The documentation on 09/06/20 for  the exam, diagnosis, procedures, and orders are all accurate and complete.    Vernie Murders, PA-C  [employer] [phone] (phone) [phone] (fax)  [place]

## 2020-09-06 NOTE — Patient Instructions (Signed)

## 2020-09-21 DIAGNOSIS — R5383 Other fatigue: Secondary | ICD-10-CM | POA: Diagnosis not present

## 2020-09-21 DIAGNOSIS — E559 Vitamin D deficiency, unspecified: Secondary | ICD-10-CM | POA: Diagnosis not present

## 2020-09-21 DIAGNOSIS — E039 Hypothyroidism, unspecified: Secondary | ICD-10-CM | POA: Diagnosis not present

## 2020-09-22 LAB — CBC WITH DIFFERENTIAL/PLATELET
Basophils Absolute: 0 10*3/uL (ref 0.0–0.2)
Basos: 1 %
EOS (ABSOLUTE): 0.2 10*3/uL (ref 0.0–0.4)
Eos: 5 %
Hematocrit: 38.6 % (ref 34.0–46.6)
Hemoglobin: 12.6 g/dL (ref 11.1–15.9)
Immature Grans (Abs): 0 10*3/uL (ref 0.0–0.1)
Immature Granulocytes: 0 %
Lymphocytes Absolute: 1.4 10*3/uL (ref 0.7–3.1)
Lymphs: 34 %
MCH: 30.6 pg (ref 26.6–33.0)
MCHC: 32.6 g/dL (ref 31.5–35.7)
MCV: 94 fL (ref 79–97)
Monocytes Absolute: 0.3 10*3/uL (ref 0.1–0.9)
Monocytes: 7 %
Neutrophils Absolute: 2.1 10*3/uL (ref 1.4–7.0)
Neutrophils: 53 %
Platelets: 178 10*3/uL (ref 150–450)
RBC: 4.12 x10E6/uL (ref 3.77–5.28)
RDW: 12.2 % (ref 11.7–15.4)
WBC: 4 10*3/uL (ref 3.4–10.8)

## 2020-09-22 LAB — COMPREHENSIVE METABOLIC PANEL
ALT: 11 IU/L (ref 0–32)
AST: 21 IU/L (ref 0–40)
Albumin/Globulin Ratio: 1.8 (ref 1.2–2.2)
Albumin: 4 g/dL (ref 3.8–4.8)
Alkaline Phosphatase: 65 IU/L (ref 44–121)
BUN/Creatinine Ratio: 12 (ref 9–23)
BUN: 10 mg/dL (ref 6–24)
Bilirubin Total: 0.3 mg/dL (ref 0.0–1.2)
CO2: 23 mmol/L (ref 20–29)
Calcium: 8.8 mg/dL (ref 8.7–10.2)
Chloride: 107 mmol/L — ABNORMAL HIGH (ref 96–106)
Creatinine, Ser: 0.86 mg/dL (ref 0.57–1.00)
Globulin, Total: 2.2 g/dL (ref 1.5–4.5)
Glucose: 92 mg/dL (ref 65–99)
Potassium: 4.3 mmol/L (ref 3.5–5.2)
Sodium: 143 mmol/L (ref 134–144)
Total Protein: 6.2 g/dL (ref 6.0–8.5)
eGFR: 83 mL/min/{1.73_m2} (ref >59)

## 2020-09-22 LAB — T4: T4, Total: 7.2 ug/dL (ref 4.5–12.0)

## 2020-09-22 LAB — TSH: TSH: 7.91 u[IU]/mL — ABNORMAL HIGH (ref 0.450–4.500)

## 2020-09-22 LAB — VITAMIN D 25 HYDROXY (VIT D DEFICIENCY, FRACTURES): Vit D, 25-Hydroxy: 33.6 ng/mL (ref 30.0–100.0)

## 2020-09-24 ENCOUNTER — Telehealth: Payer: Self-pay | Admitting: Family Medicine

## 2020-09-24 ENCOUNTER — Other Ambulatory Visit: Payer: Self-pay

## 2020-09-24 DIAGNOSIS — E034 Atrophy of thyroid (acquired): Secondary | ICD-10-CM

## 2020-09-24 MED ORDER — LEVOTHYROXINE SODIUM 25 MCG PO TABS: 50.0000 ug | ORAL_TABLET | Freq: Every day | ORAL | 3 refills | Status: AC

## 2020-09-24 NOTE — Telephone Encounter (Signed)
Patient calling about lab results. Please call back when released.

## 2020-09-24 NOTE — Telephone Encounter (Signed)
Sane from [facility] states directions are confusing to them says 25 mg twice a day, but system says '50mg'$  twice a day. Please call back to clarify

## 2020-09-27 NOTE — Telephone Encounter (Signed)
It should be 50 MCG qd. Was on 25 MCG 2 tablets daily to finish up what she had at home.

## 2020-10-05 DIAGNOSIS — H5213 Myopia, bilateral: Secondary | ICD-10-CM | POA: Diagnosis not present

## 2020-10-15 ENCOUNTER — Encounter: Payer: Self-pay | Admitting: General Surgery

## 2020-12-13 DIAGNOSIS — H0288B Meibomian gland dysfunction left eye, upper and lower eyelids: Secondary | ICD-10-CM | POA: Diagnosis not present

## 2020-12-13 DIAGNOSIS — H0288A Meibomian gland dysfunction right eye, upper and lower eyelids: Secondary | ICD-10-CM | POA: Diagnosis not present

## 2020-12-13 DIAGNOSIS — H16223 Keratoconjunctivitis sicca, not specified as Sjogren's, bilateral: Secondary | ICD-10-CM | POA: Diagnosis not present

## 2020-12-13 DIAGNOSIS — H0100A Unspecified blepharitis right eye, upper and lower eyelids: Secondary | ICD-10-CM | POA: Diagnosis not present

## 2021-01-12 DIAGNOSIS — G4700 Insomnia, unspecified: Secondary | ICD-10-CM | POA: Diagnosis not present

## 2021-01-12 DIAGNOSIS — C50412 Malignant neoplasm of upper-outer quadrant of left female breast: Secondary | ICD-10-CM | POA: Diagnosis not present

## 2021-01-12 DIAGNOSIS — R232 Flushing: Secondary | ICD-10-CM | POA: Diagnosis not present

## 2021-01-12 DIAGNOSIS — Z170 Estrogen receptor positive status [ER+]: Secondary | ICD-10-CM | POA: Diagnosis not present

## 2021-01-12 DIAGNOSIS — Z23 Encounter for immunization: Secondary | ICD-10-CM | POA: Diagnosis not present

## 2021-01-12 DIAGNOSIS — D249 Benign neoplasm of unspecified breast: Secondary | ICD-10-CM | POA: Diagnosis not present

## 2021-01-12 DIAGNOSIS — Z79810 Long term (current) use of selective estrogen receptor modulators (SERMs): Secondary | ICD-10-CM | POA: Diagnosis not present

## 2021-03-08 ENCOUNTER — Other Ambulatory Visit: Payer: Self-pay

## 2021-03-08 ENCOUNTER — Telehealth: Payer: Self-pay | Admitting: Family Medicine

## 2021-03-08 DIAGNOSIS — E034 Atrophy of thyroid (acquired): Secondary | ICD-10-CM

## 2021-03-08 NOTE — Telephone Encounter (Signed)
[facility] faxed refill request for the following medications: ? ?levothyroxine (SYNTHROID) 25 MCG tablet  ? ?Please advise. ? ?

## 2021-03-08 NOTE — Telephone Encounter (Signed)
Converted to refill request

## 2021-03-22 DIAGNOSIS — G479 Sleep disorder, unspecified: Secondary | ICD-10-CM | POA: Diagnosis not present

## 2021-03-22 DIAGNOSIS — E639 Nutritional deficiency, unspecified: Secondary | ICD-10-CM | POA: Diagnosis not present

## 2021-03-22 DIAGNOSIS — R5383 Other fatigue: Secondary | ICD-10-CM | POA: Diagnosis not present

## 2021-03-22 DIAGNOSIS — R5382 Chronic fatigue, unspecified: Secondary | ICD-10-CM | POA: Diagnosis not present

## 2021-03-31 DIAGNOSIS — R922 Inconclusive mammogram: Secondary | ICD-10-CM | POA: Diagnosis not present

## 2021-03-31 DIAGNOSIS — Z08 Encounter for follow-up examination after completed treatment for malignant neoplasm: Secondary | ICD-10-CM | POA: Diagnosis not present

## 2021-03-31 DIAGNOSIS — Z79810 Long term (current) use of selective estrogen receptor modulators (SERMs): Secondary | ICD-10-CM | POA: Diagnosis not present

## 2021-03-31 DIAGNOSIS — C50412 Malignant neoplasm of upper-outer quadrant of left female breast: Secondary | ICD-10-CM | POA: Diagnosis not present

## 2021-03-31 DIAGNOSIS — Z853 Personal history of malignant neoplasm of breast: Secondary | ICD-10-CM | POA: Diagnosis not present

## 2021-03-31 DIAGNOSIS — Z170 Estrogen receptor positive status [ER+]: Secondary | ICD-10-CM | POA: Diagnosis not present

## 2021-04-27 IMAGING — US US BREAST*R* LIMITED INC AXILLA
1 series · 5 of 5 positions shown · non-contrast
Comparison: None.

CLINICAL DATA: 48-year-old female presenting for evaluation of a
palpable lump in the left breast. This is the patient's new baseline
exam.

EXAM:
DIGITAL DIAGNOSTIC BILATERAL MAMMOGRAM WITH CAD AND TOMO
ULTRASOUND BILATERAL BREAST

[Series 1: us breast*right* limited inc axilla · 0.06mm/px · 5 of 5 slices shown]
[im 1/5]
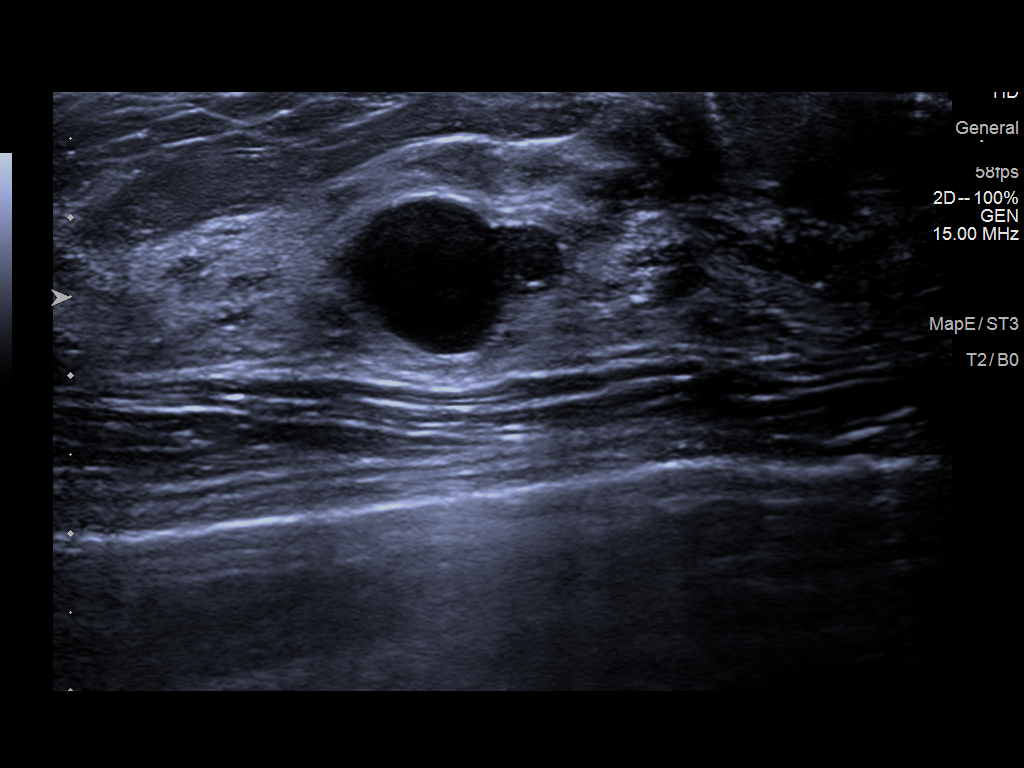
[im 2/5]
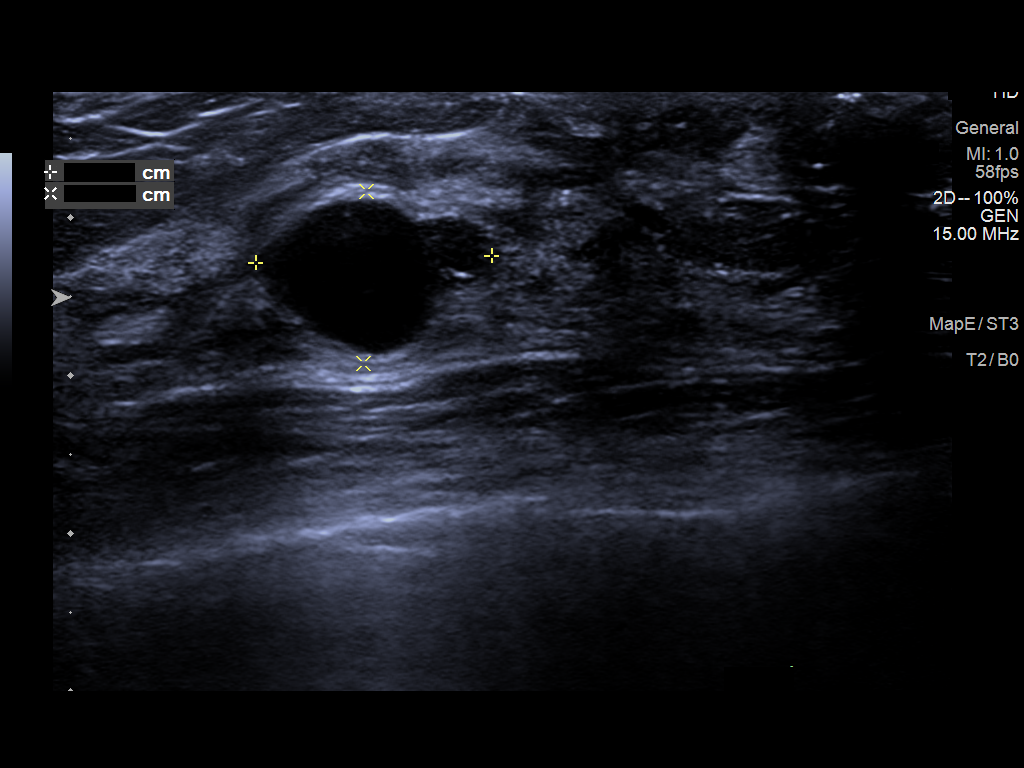
[im 3/5]
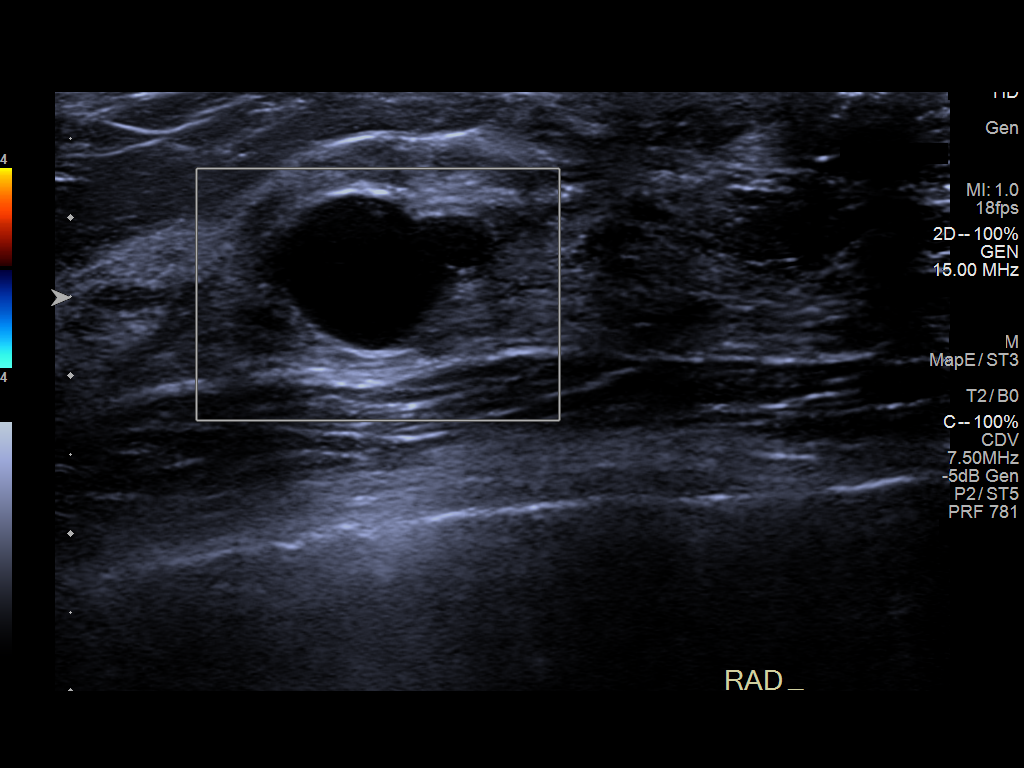
[im 4/5]
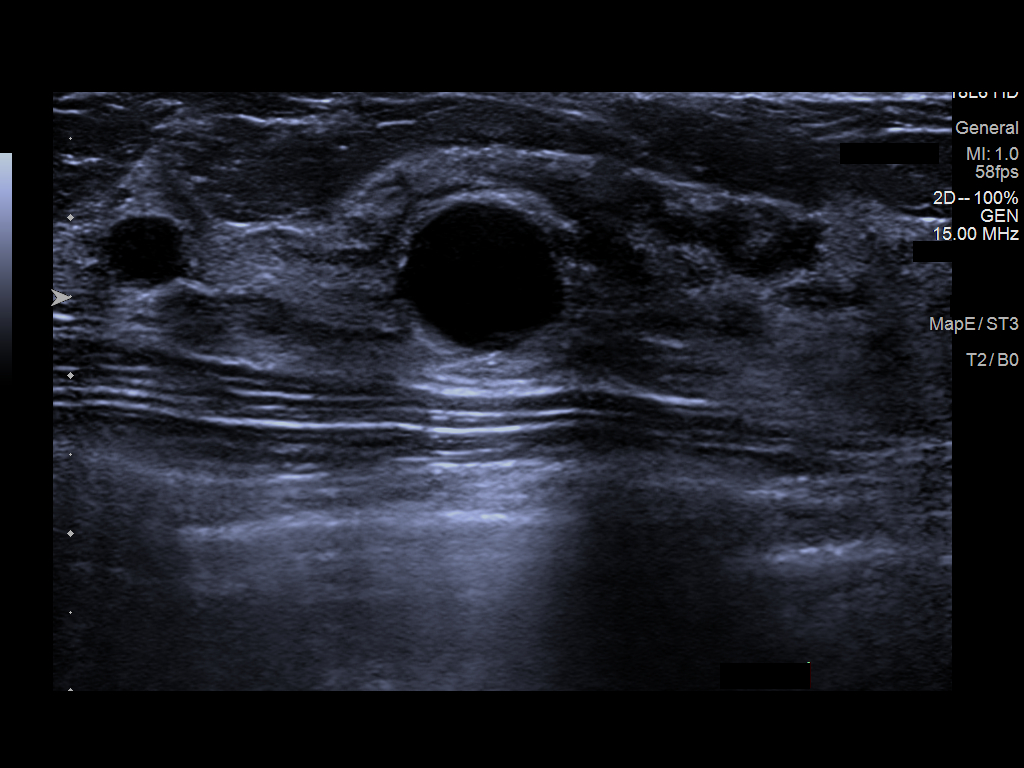
[im 5/5]
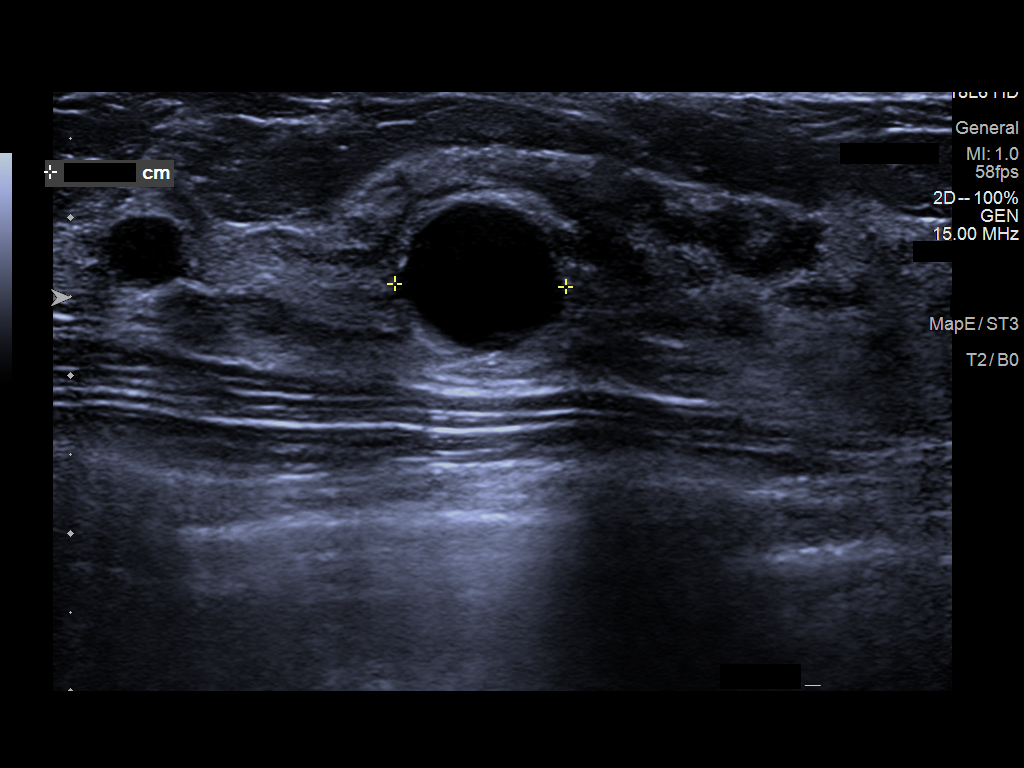

[5 of 5 positions shown; findings below may reference images not displayed]

ACR Breast Density Category d: The breast tissue is extremely dense,
which lowers the sensitivity of mammography.
FINDINGS: At the palpable site in the lower outer left breast which has been
marked with a BB, there is an circumscribed oval mass measuring
cm. Smaller cluster obscured masses are seen in the upper-outer
quadrant of the left breast. There is in irregular asymmetry in the
left axilla, measuring approximately 0.8 cm. An asymmetry/possible
obscured mass is seen in the lower outer quadrant of the right
breast, anterior depth.

Mammographic images were processed with CAD.

Ultrasound of the palpable site in the left breast at 5 o'clock, 6
cm from the nipple demonstrates a large benign anechoic cyst
measuring 2.8 x 1.7 x 2.3 cm. There tiny adjacent benign cyst as
well.

Ultrasound of the left axilla demonstrates an irregular hypoechoic
mass with indistinct margins. In the transverse plane the mass
appears taller than wide, and measures 0.6 x 0.6 x 0.5 cm. Blood
flow is seen within the mass on color Doppler imaging. This
corresponds with the axillary asymmetry seen on the patient's
mammogram.

Ultrasound of the superior left breast demonstrates an oval mass
with angular margins at 12 o'clock, 3 cm from the nipple measuring
1.1 x 0.6 x 0.9 cm. Adjacent to this at [DATE], 3 cm from the nipple
there is a mixed hypoechoic and anechoic mass which may represent a
complicated cyst measuring 0.8 x 0.4 x 0.8 cm.

Ultrasound of the left axilla demonstrates multiple normal-appearing
lymph nodes.

Ultrasound of the retroareolar right breast demonstrates multiple
anechoic oval cystic masses consistent with benign cysts.
IMPRESSION: 1. There is a suspicious mass in the left axilla which measures
cm.

2. There is an indeterminate mass in the left breast at 12 o'clock.
Features favor a fibroadenoma, however malignancy cannot be
excluded.

3. There is a possible complicated cyst in the left breast at [DATE].

4.  No evidence of left axillary lymphadenopathy.

5. The palpable lump in the left breast at 5 o'clock corresponds
with a large benign cyst.

6. Fibrocystic change noted in the right breast. No evidence of
malignancy in the right breast.

RECOMMENDATION:
Ultrasound guided biopsy is recommended for the left axillary mass
and the left breast mass at 12 o'clock. Ultrasound-guided aspiration
is recommended for the small probable complicated cyst in the left
breast at [DATE]. If this does not aspirate, biopsy is recommended.

I have discussed the findings and recommendations with the patient.
If applicable, a reminder letter will be sent to the patient
regarding the next appointment.

BI-RADS CATEGORY  5: Highly suggestive of malignancy.

## 2021-04-30 IMAGING — MG US BREAST BX W LOC DEV 1ST LESION IMG BX SPEC US GUIDE*L*
1 series · 7 of 8 positions shown · non-contrast
Comparison: Previous exam(s).
COMPARISON: Previous exam(s).

Addendum:
CLINICAL DATA: Left axillary and left breast masses for biopsy.

EXAM:
ULTRASOUND GUIDED left BREAST CORE NEEDLE BIOPSY

[Series 1: MG view · 0.05mm/px · 7 of 39 slices shown]
[im 1/39]
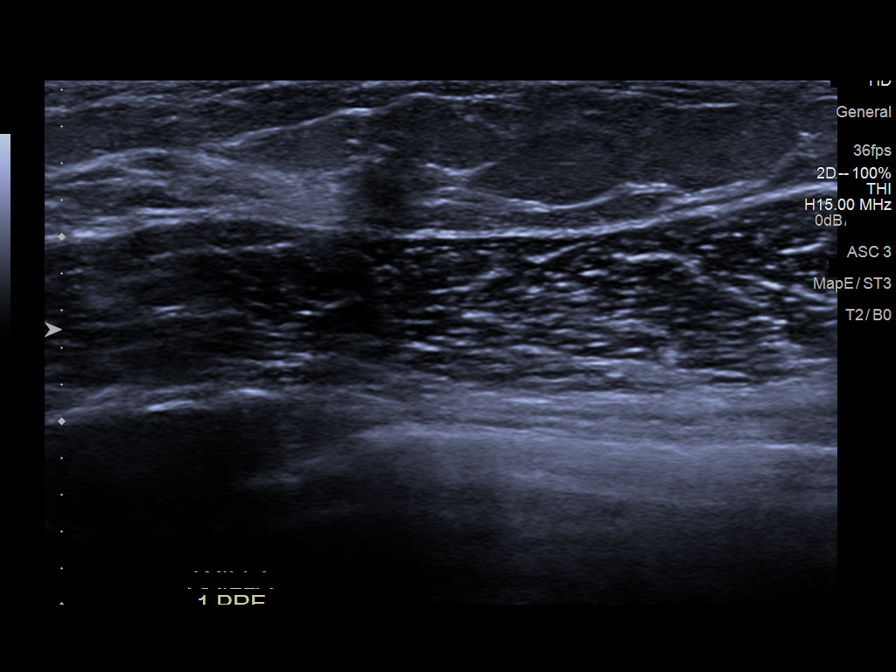
[im 6/39]
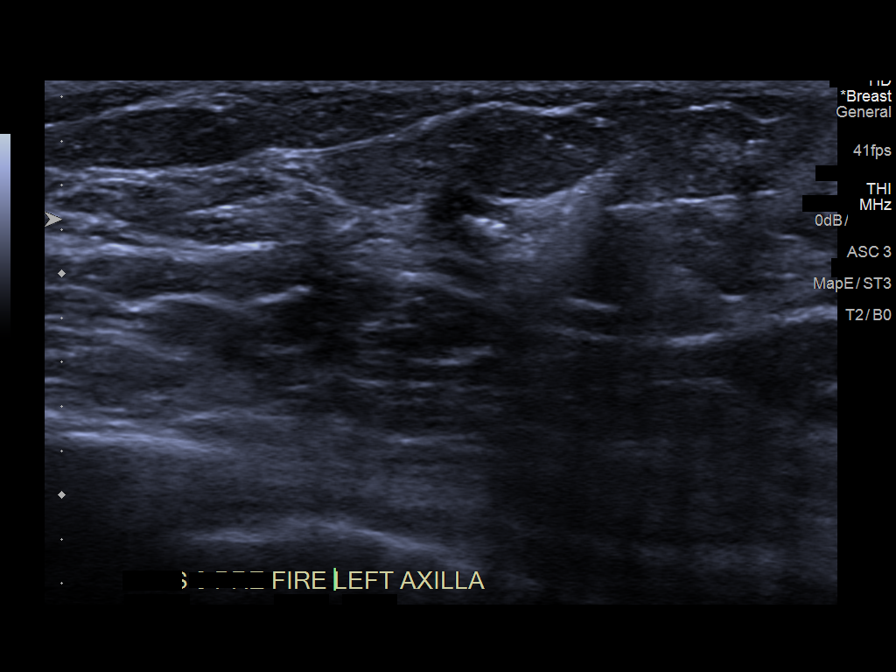
[im 11/39]
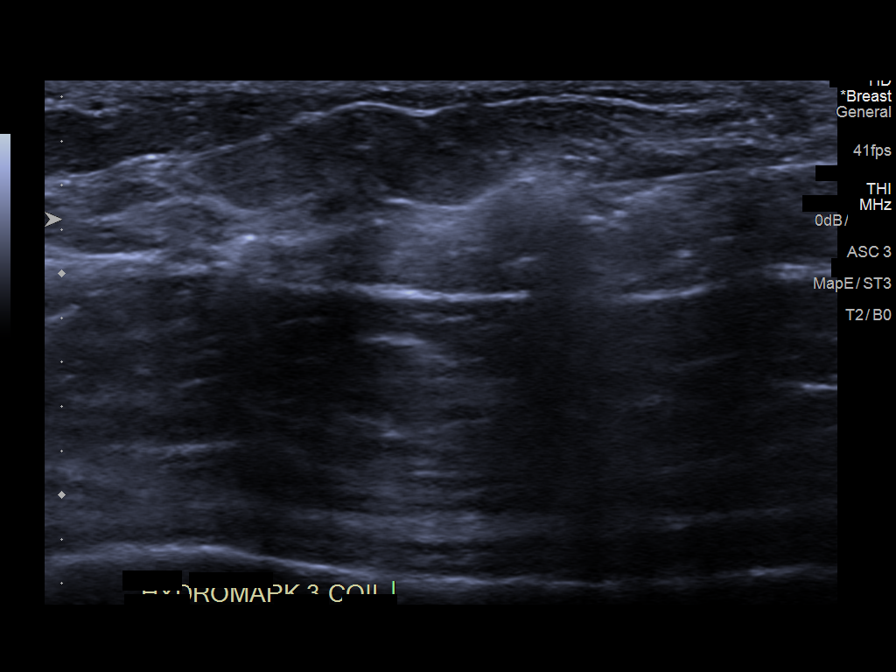
[im 17/39]
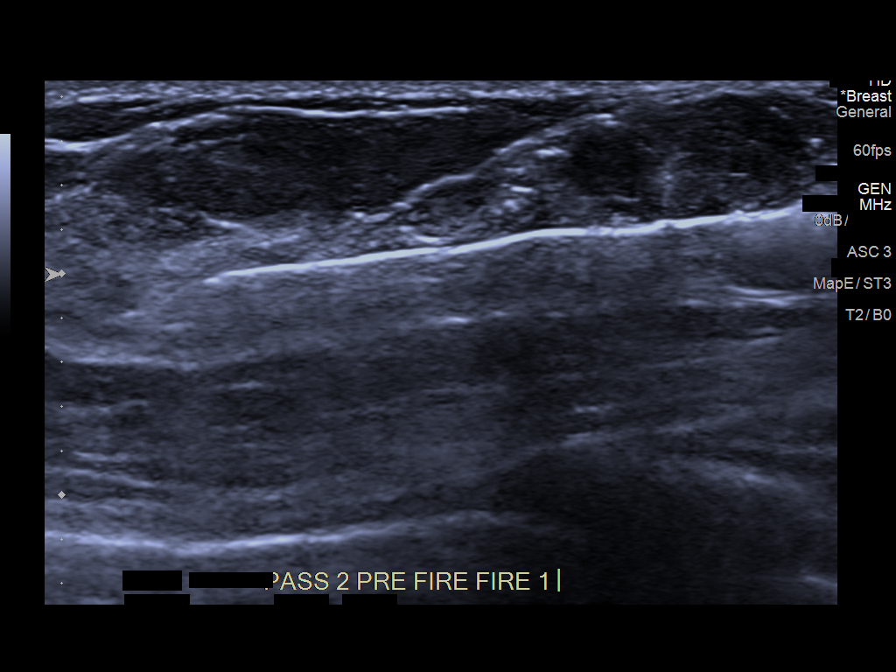
[im 22/39]
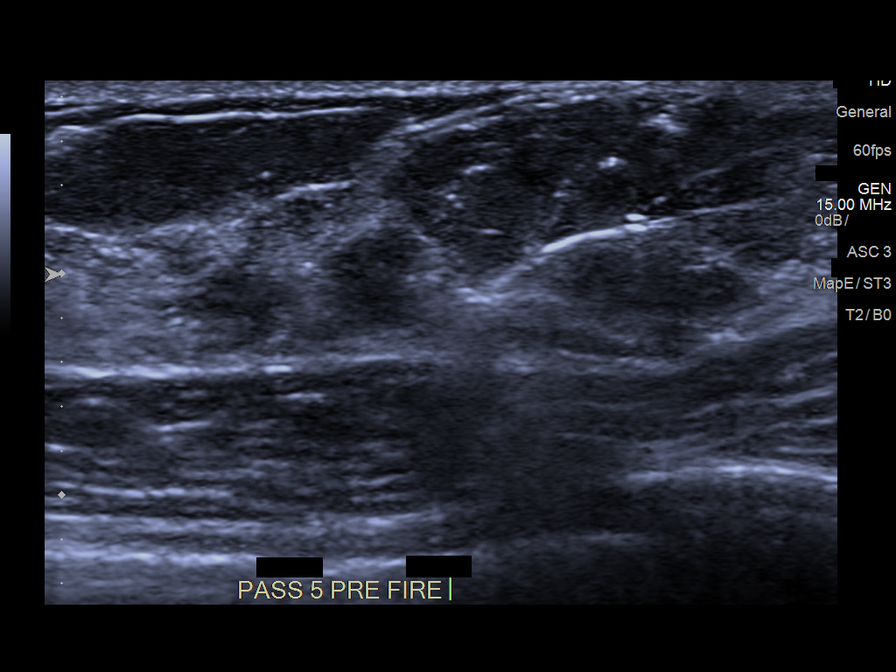
[im 28/39]
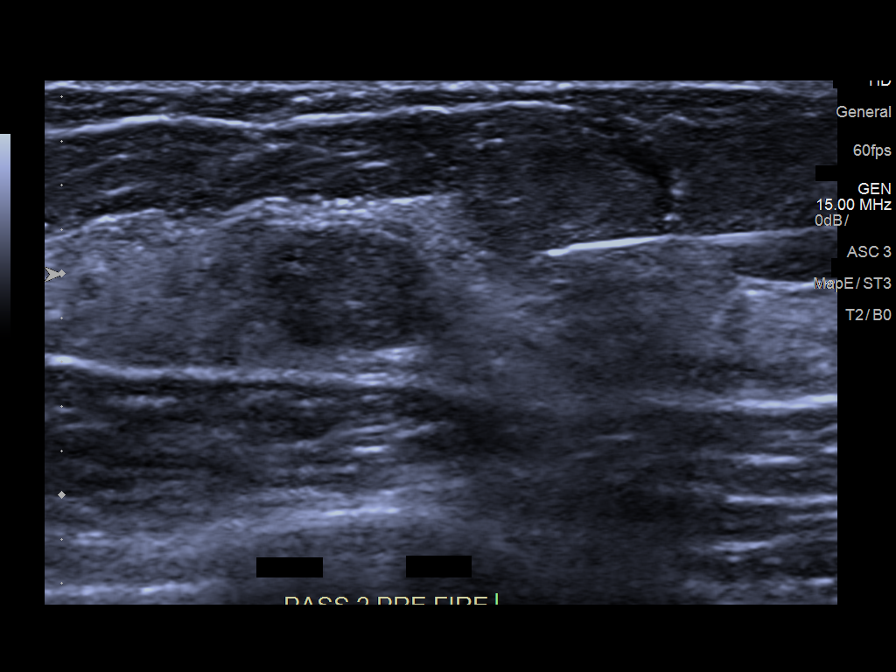
[im 33/39]
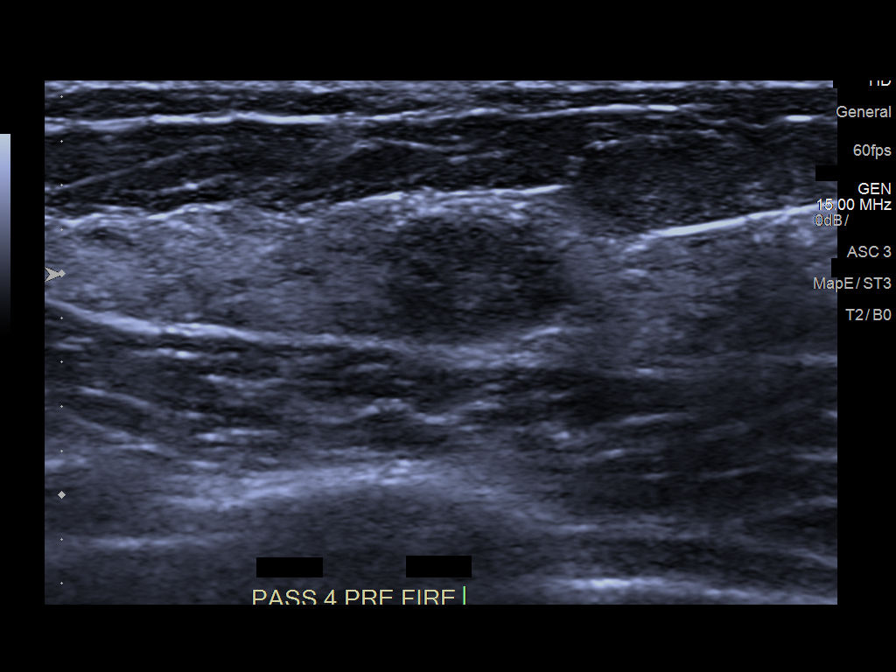

[7 of 8 positions shown; findings below may reference images not displayed]



Lesion quadrant: Left axilla

Using sterile technique and 1% Lidocaine as local anesthetic, under
direct ultrasound visualization, a 14 gauge Hadijjah device was
used to perform biopsy of 6 mm hypoechoic mass in the left axilla
using a lateral approach. At the conclusion of the procedure a
HydroMARK tissue marker clip was deployed into the biopsy cavity.
Follow up 2 view mammogram was performed and dictated separately.

Lesion quadrant: Upper-outer quadrant

Using sterile technique and 1% Lidocaine as local anesthetic, under
direct ultrasound visualization, a 14 gauge Hadijjah device was
used to perform biopsy of 8 mm mass at left breast 12:30 o'clock 3
cm from nipple using a lateral approach. At the conclusion of the
procedure a venous tissue marker clip was deployed into the biopsy
cavity. Follow up 2 view mammogram was performed and dictated
separately.

Lesion quadrant: Left breast 12 o'clock

Using sterile technique and 1% Lidocaine as local anesthetic, under
direct ultrasound visualization, a 14 gauge Hadijjah device was
used to perform biopsy of 11 mm mass at left breast 12 o'clock 3 cm
from nipple using a lateral approach. At the conclusion of the
procedure a rib in tissue marker clip was deployed into the biopsy
cavity. Follow up 2 view mammogram was performed and dictated
separately.
IMPRESSION: Ultrasound guided biopsies of left breast. No apparent
complications.

ADDENDUM:
PATHOLOGY revealed:

A. AXILLARY MASS, LEFT; ULTRASOUND- GUIDED CORE BIOPSY: - INVASIVE
MAMMARY CARCINOMA, NO SPECIAL TYPE. - SEE COMMENT. 2.8 mm in this
sample. Grade 1. Ductal carcinoma in situ: Not identified.
Lymphovascular invasion: Not identified.

Comment: There is no lymph node tissue identified within specimen A
(axillary mass). There are fragments of breast tissue with invasive
carcinoma and fragments of skeletal muscle. Clinical correlation is
recommended.

B. LEFT BREAST, [DATE]; ULTRASOUND-GUIDED CORE BIOPSY: -
FIBROADENOMA. - NEGATIVE FOR ATYPIA AND MALIGNANCY.

C. LEFT BREAST, [DATE]; ULTRASOUND-GUIDED CORE BIOPSY: -
FIBROADENOMA. - NEGATIVE FOR ATYPIA AND MALIGNANCY.

Comment: Immunohistochemical stains for p63 and calponin (block A1)
demonstrate an absence of myoepithelial cells in the area of
interest.

Pathology results are CONCORDANT with imaging findings, per Dr.
Maronza Rito.

Pathology results and recommendations below were discussed with
patient by telephone on 02/11/2019. Patient reported biopsy site
doing well with slight tenderness at the site. Post biopsy care
instructions were reviewed and questions were answered. Patient was
instructed to call [HOSPITAL] if any concerns or
questions arise related to the biopsy.

Recommendation: Surgical referral. Request for surgical referral was
relayed to nurse navigators at [HOSPITAL] [HOSPITAL] by
Filomena Omara RN on 02/11/2019.

Addendum by Filomena Omara RN on 02/11/2019.



Lesion quadrant: Left axilla

Using sterile technique and 1% Lidocaine as local anesthetic, under
direct ultrasound visualization, a 14 gauge Hadijjah device was
used to perform biopsy of 6 mm hypoechoic mass in the left axilla
using a lateral approach. At the conclusion of the procedure a
HydroMARK tissue marker clip was deployed into the biopsy cavity.
Follow up 2 view mammogram was performed and dictated separately.

Lesion quadrant: Upper-outer quadrant

Using sterile technique and 1% Lidocaine as local anesthetic, under
direct ultrasound visualization, a 14 gauge Hadijjah device was
used to perform biopsy of 8 mm mass at left breast 12:30 o'clock 3
cm from nipple using a lateral approach. At the conclusion of the
procedure a venous tissue marker clip was deployed into the biopsy
cavity. Follow up 2 view mammogram was performed and dictated
separately.

Lesion quadrant: Left breast 12 o'clock

Using sterile technique and 1% Lidocaine as local anesthetic, under
direct ultrasound visualization, a 14 gauge Hadijjah device was
used to perform biopsy of 11 mm mass at left breast 12 o'clock 3 cm
from nipple using a lateral approach. At the conclusion of the
procedure a rib in tissue marker clip was deployed into the biopsy
cavity. Follow up 2 view mammogram was performed and dictated
separately.
IMPRESSION: Ultrasound guided biopsies of left breast. No apparent
complications.

## 2021-05-13 DIAGNOSIS — R82998 Other abnormal findings in urine: Secondary | ICD-10-CM | POA: Diagnosis not present

## 2021-05-13 DIAGNOSIS — E038 Other specified hypothyroidism: Secondary | ICD-10-CM | POA: Diagnosis not present

## 2021-05-13 DIAGNOSIS — E063 Autoimmune thyroiditis: Secondary | ICD-10-CM | POA: Diagnosis not present

## 2021-06-28 DIAGNOSIS — N841 Polyp of cervix uteri: Secondary | ICD-10-CM | POA: Diagnosis not present

## 2021-06-28 DIAGNOSIS — N950 Postmenopausal bleeding: Secondary | ICD-10-CM | POA: Diagnosis not present

## 2021-07-07 ENCOUNTER — Ambulatory Visit (INDEPENDENT_AMBULATORY_CARE_PROVIDER_SITE_OTHER): Payer: BC Managed Care – PPO | Admitting: Podiatry

## 2021-07-07 ENCOUNTER — Encounter: Payer: Self-pay | Admitting: Podiatry

## 2021-07-07 DIAGNOSIS — B070 Plantar wart: Secondary | ICD-10-CM

## 2021-07-07 DIAGNOSIS — L600 Ingrowing nail: Secondary | ICD-10-CM

## 2021-07-11 ENCOUNTER — Encounter: Payer: Self-pay | Admitting: Podiatry

## 2021-07-13 DIAGNOSIS — Z6822 Body mass index (BMI) 22.0-22.9, adult: Secondary | ICD-10-CM | POA: Diagnosis not present

## 2021-07-13 DIAGNOSIS — C50412 Malignant neoplasm of upper-outer quadrant of left female breast: Secondary | ICD-10-CM | POA: Diagnosis not present

## 2021-07-13 DIAGNOSIS — Z170 Estrogen receptor positive status [ER+]: Secondary | ICD-10-CM | POA: Diagnosis not present

## 2021-07-14 ENCOUNTER — Encounter: Payer: Self-pay | Admitting: Podiatry

## 2021-07-14 MED ORDER — DOXYCYCLINE HYCLATE 100 MG PO TABS: 100.0000 mg | ORAL_TABLET | Freq: Two times a day (BID) | ORAL | 1 refills | Status: AC

## 2021-07-14 NOTE — Telephone Encounter (Signed)
Please advise 

## 2021-07-14 NOTE — Addendum Note (Signed)
Addended by: Wallene Huh on: 07/14/2021 12:14 PM   Modules accepted: Orders

## 2021-07-28 DIAGNOSIS — R9389 Abnormal findings on diagnostic imaging of other specified body structures: Secondary | ICD-10-CM | POA: Diagnosis not present

## 2021-07-28 DIAGNOSIS — N930 Postcoital and contact bleeding: Secondary | ICD-10-CM | POA: Diagnosis not present

## 2021-08-11 DIAGNOSIS — N950 Postmenopausal bleeding: Secondary | ICD-10-CM | POA: Diagnosis not present

## 2021-08-11 DIAGNOSIS — Z888 Allergy status to other drugs, medicaments and biological substances status: Secondary | ICD-10-CM | POA: Diagnosis not present

## 2021-08-11 DIAGNOSIS — Z6822 Body mass index (BMI) 22.0-22.9, adult: Secondary | ICD-10-CM | POA: Diagnosis not present

## 2021-08-11 DIAGNOSIS — N9089 Other specified noninflammatory disorders of vulva and perineum: Secondary | ICD-10-CM | POA: Diagnosis not present

## 2021-08-11 DIAGNOSIS — Z91018 Allergy to other foods: Secondary | ICD-10-CM | POA: Diagnosis not present
